# Patient Record
Sex: Female | Born: 1945 | Race: White | Hispanic: No | Marital: Married | Smoking: Former smoker
Health system: Southern US, Community
[De-identification: ages and names within clinical notes are randomized; demographics above are authoritative.]

## PROBLEM LIST (undated history)

## (undated) DIAGNOSIS — E039 Hypothyroidism, unspecified: Secondary | ICD-10-CM

## (undated) DIAGNOSIS — Z923 Personal history of irradiation: Secondary | ICD-10-CM

## (undated) DIAGNOSIS — K635 Polyp of colon: Secondary | ICD-10-CM

## (undated) DIAGNOSIS — M810 Age-related osteoporosis without current pathological fracture: Secondary | ICD-10-CM

## (undated) DIAGNOSIS — M1990 Unspecified osteoarthritis, unspecified site: Secondary | ICD-10-CM

## (undated) DIAGNOSIS — Z9889 Other specified postprocedural states: Secondary | ICD-10-CM

## (undated) HISTORY — DX: Hypothyroidism, unspecified: E03.9

## (undated) HISTORY — DX: Polyp of colon: K63.5

## (undated) HISTORY — DX: Age-related osteoporosis without current pathological fracture: M81.0

## (undated) HISTORY — PX: CERVICAL LAMINECTOMY: SHX94

## (undated) HISTORY — PX: BREAST LUMPECTOMY: SHX2

## (undated) HISTORY — PX: TUBAL LIGATION: SHX77

---

## 2010-11-20 DIAGNOSIS — E559 Vitamin D deficiency, unspecified: Secondary | ICD-10-CM | POA: Insufficient documentation

## 2010-12-12 DIAGNOSIS — E119 Type 2 diabetes mellitus without complications: Secondary | ICD-10-CM | POA: Insufficient documentation

## 2012-08-17 DIAGNOSIS — M4802 Spinal stenosis, cervical region: Secondary | ICD-10-CM | POA: Insufficient documentation

## 2018-07-28 DIAGNOSIS — E039 Hypothyroidism, unspecified: Secondary | ICD-10-CM | POA: Insufficient documentation

## 2018-07-28 DIAGNOSIS — M810 Age-related osteoporosis without current pathological fracture: Secondary | ICD-10-CM | POA: Insufficient documentation

## 2018-12-22 ENCOUNTER — Other Ambulatory Visit: Payer: Self-pay | Admitting: Physician Assistant

## 2018-12-22 DIAGNOSIS — Z1231 Encounter for screening mammogram for malignant neoplasm of breast: Secondary | ICD-10-CM

## 2019-01-14 ENCOUNTER — Other Ambulatory Visit: Payer: Self-pay | Admitting: Physician Assistant

## 2019-01-14 DIAGNOSIS — E2839 Other primary ovarian failure: Secondary | ICD-10-CM

## 2019-01-14 DIAGNOSIS — E782 Mixed hyperlipidemia: Secondary | ICD-10-CM | POA: Insufficient documentation

## 2019-01-26 ENCOUNTER — Ambulatory Visit
Admission: RE | Admit: 2019-01-26 | Discharge: 2019-01-26 | Disposition: A | Discharge status: Home / Self Care | Payer: Medicare Other | Source: Ambulatory Visit | Attending: Physician Assistant | Admitting: Physician Assistant

## 2019-01-26 DIAGNOSIS — Z1231 Encounter for screening mammogram for malignant neoplasm of breast: Secondary | ICD-10-CM

## 2019-03-22 ENCOUNTER — Other Ambulatory Visit: Payer: Self-pay

## 2019-05-05 ENCOUNTER — Ambulatory Visit
Admission: RE | Admit: 2019-05-05 | Discharge: 2019-05-05 | Disposition: A | Discharge status: Home / Self Care | Payer: Medicare Other | Source: Ambulatory Visit | Attending: Physician Assistant | Admitting: Physician Assistant

## 2019-05-05 DIAGNOSIS — E2839 Other primary ovarian failure: Secondary | ICD-10-CM

## 2019-12-21 ENCOUNTER — Other Ambulatory Visit: Payer: Self-pay | Admitting: Physician Assistant

## 2019-12-21 DIAGNOSIS — Z1231 Encounter for screening mammogram for malignant neoplasm of breast: Secondary | ICD-10-CM

## 2020-01-31 ENCOUNTER — Ambulatory Visit: Payer: Medicare Other

## 2020-03-26 ENCOUNTER — Other Ambulatory Visit: Payer: Self-pay

## 2020-03-26 ENCOUNTER — Ambulatory Visit
Admission: RE | Admit: 2020-03-26 | Discharge: 2020-03-26 | Disposition: A | Discharge status: Home / Self Care | Payer: Medicare Other | Source: Ambulatory Visit | Attending: Physician Assistant | Admitting: Physician Assistant

## 2020-03-26 DIAGNOSIS — Z1231 Encounter for screening mammogram for malignant neoplasm of breast: Secondary | ICD-10-CM

## 2020-07-06 ENCOUNTER — Telehealth: Payer: Self-pay | Admitting: Gastroenterology

## 2020-07-06 NOTE — Telephone Encounter (Signed)
Hi Dr. Ardis Hughs., we have received a referral from patient's PCP for a repeat colon. Patient had colon in 2016. Records have been received and they will be sent to you for review. Please advise on scheduling. Thank you.

## 2020-07-17 NOTE — Telephone Encounter (Signed)
   Colonoscopy April 2016 indication history of polyps.  Findings mild inflammation in the proximal ascending colon.  Biopsies were taken.  This showed patchy active colitis "main differential diagnosis includes NSAIDs, infection and Crohn's disease".    Colonoscopy March 2011, [place].  Indication history of colon polyps.  Findings for subcentimeter polyps.  All were removed.,  1 was a tubular adenoma, the other 3 were not precancerous or neoplastic even.  Colonoscopy October 2005, [place].  Indication "history of colon polyps, finding internal hemorrhoids.  The examination was normal otherwise.  Colonoscopy February 2002, indication FOB positive stool, single subcentimeter polyp was removed.  It was an adenoma.   Can you please contact her, let her know that I reviewed all of her records including her 4 colonoscopies from [place].  She has had 2 subcentimeter adenomas removed in about 15 years.  Current nationally recognized polyp surveillance guidelines would put her at at "routine risk" for colon polyps and colon cancer.  Letter know I think she needs colonoscopy April 2026.  If she has any questions or concerns am happy to discuss this with her in the office.  Thank you

## 2020-07-18 NOTE — Telephone Encounter (Signed)
Left message to call back  

## 2020-07-20 ENCOUNTER — Encounter: Payer: Self-pay | Admitting: Gastroenterology

## 2020-09-19 ENCOUNTER — Encounter: Payer: Self-pay | Admitting: Gastroenterology

## 2020-09-19 ENCOUNTER — Ambulatory Visit: Payer: Medicare Other | Admitting: Gastroenterology

## 2020-09-19 VITALS — BP 148/74 | HR 68 | Ht 62.0 in | Wt 133.6 lb

## 2020-09-19 DIAGNOSIS — Z86010 Personal history of colonic polyps: Secondary | ICD-10-CM

## 2020-09-19 NOTE — Patient Instructions (Addendum)
If you are age 73 or older, your body mass index should be between 23-30. Your Body mass index is 24.44 kg/m. If this is out of the aforementioned range listed, please consider follow up with your Primary Care Provider.  If you are age 29 or younger, your body mass index should be between 19-25. Your Body mass index is 24.44 kg/m. If this is out of the aformentioned range listed, please consider follow up with your Primary Care Provider.   You are due for colonoscopy in April 2026.  We will contact you when it is time to schedule.  You will follow up with our office on an as needed basis.  Thank you for entrusting me with your care and choosing [facility].  Dr Ardis Hughs

## 2020-09-19 NOTE — Progress Notes (Signed)
HPI: This is a very pleasant 74 year old woman who was referred to me by Nicholes Rough, PA-C  to evaluate history of colon polyps.    She is here with her husband today.  They moved from [place] about 2 years ago.  I reviewed extensive records from her previous gastroenterology team in [place].  See those results all summarized below.  Colon cancer does not run in her family  Her weight is overall stable  She has no significant abdominal pains, no changes in her bowels, no constipation or diarrhea.  No overt GI bleeding  Old Data Reviewed: Colonoscopy April 2016 indication history of polyps.  Findings mild inflammation in the proximal ascending colon.  Biopsies were taken.  This showed patchy active colitis "main differential diagnosis includes NSAIDs, infection and Crohn's disease".    Colonoscopy March 2011, [place].  Indication history of colon polyps.  Findings for subcentimeter polyps.  All were removed.,  1 was a tubular adenoma, the other 3 were not precancerous or neoplastic even.  Colonoscopy October 2005, [place].  Indication "history of colon polyps, finding internal hemorrhoids.  The examination was normal otherwise.  Colonoscopy February 2002, indication FOB positive stool, single subcentimeter polyp was removed.  It was an adenoma.  Blood work April 2021; CBC was completely normal  Review of systems: Pertinent positive and negative review of systems were noted in the above HPI section. All other review negative.   Past Medical History:  Diagnosis Date  . Colon polyps   . Hypothyroidism   . Osteoporosis     Past Surgical History:  Procedure Laterality Date  . CERVICAL LAMINECTOMY    . TUBAL LIGATION      Current Outpatient Medications  Medication Sig Dispense Refill  . alendronate (FOSAMAX) 70 MG tablet Take 70 mg by mouth once a week.    Marland Kitchen aspirin EC 81 MG tablet Take 81 mg by mouth daily. Swallow whole.    Marland Kitchen CALCIUM PO Take 1,000 mg by mouth  daily.    . Cyanocobalamin ([employer] B12 QUICK DISSOLVE PO) Take 5,000 mcg by mouth daily.    Marland Kitchen levothyroxine (SYNTHROID) 88 MCG tablet Take 88 mcg by mouth daily.    Marland Kitchen lovastatin (MEVACOR) 40 MG tablet Take 40 mg by mouth at bedtime.    . Magnesium 500 MG CAPS Take by mouth.    . Multiple Vitamins-Minerals (MULTIVITAMIN ADULTS 50+ PO) Take 1 tablet by mouth daily.    . Omega-3 Fatty Acids (FISH OIL) 1000 MG CAPS Take 1 capsule by mouth daily.    . TURMERIC CURCUMIN PO Take 1 tablet by mouth daily.     No current facility-administered medications for this visit.    Allergies as of 09/19/2020 - Review Complete 09/19/2020  Allergen Reaction Noted  . Dust mite extract Cough 03/12/2020    Family History  Problem Relation Age of Onset  . Breast cancer Mother   . Diabetes Mother   . Colon cancer Neg Hx   . Esophageal cancer Neg Hx   . Pancreatic cancer Neg Hx   . Stomach cancer Neg Hx   . Liver disease Neg Hx     Social History   Socioeconomic History  . Marital status: Married    Spouse name: Not on file  . Number of children: Not on file  . Years of education: Not on file  . Highest education level: Not on file  Occupational History  . Not on file  Tobacco Use  . Smoking status: Former  Smoker  . Smokeless tobacco: Never Used  Substance and Sexual Activity  . Alcohol use: Yes    Comment: 1 drink daily  . Drug use: Never  . Sexual activity: Not on file  Other Topics Concern  . Not on file  Social History Narrative  . Not on file   Social Determinants of Health   Financial Resource Strain:   . Difficulty of Paying Living Expenses: Not on file  Food Insecurity:   . Worried About Charity fundraiser in the Last Year: Not on file  . Ran Out of Food in the Last Year: Not on file  Transportation Needs:   . Lack of Transportation (Medical): Not on file  . Lack of Transportation (Non-Medical): Not on file  Physical Activity:   . Days of Exercise per Week: Not on file  .  Minutes of Exercise per Session: Not on file  Stress:   . Feeling of Stress : Not on file  Social Connections:   . Frequency of Communication with Friends and Family: Not on file  . Frequency of Social Gatherings with Friends and Family: Not on file  . Attends Religious Services: Not on file  . Active Member of Clubs or Organizations: Not on file  . Attends Archivist Meetings: Not on file  . Marital Status: Not on file  Intimate Partner Violence:   . Fear of Current or Ex-Partner: Not on file  . Emotionally Abused: Not on file  . Physically Abused: Not on file  . Sexually Abused: Not on file     Physical Exam: BP (!) 148/74   Pulse 68   Ht 5\' 2"  (1.575 m)   Wt 133 lb 9.6 oz (60.6 kg)   BMI 24.44 kg/m  Constitutional: generally well-appearing Psychiatric: alert and oriented x3 Eyes: extraocular movements intact Mouth: oral pharynx moist, no lesions Neck: supple no lymphadenopathy Cardiovascular: heart regular rate and rhythm Lungs: clear to auscultation bilaterally Abdomen: soft, nontender, nondistended, no obvious ascites, no peritoneal signs, normal bowel sounds Extremities: no lower extremity edema bilaterally Skin: no lesions on visible extremities   Assessment and plan: 74 y.o. female with history of adenomatous colon polyps but considered to be at routine risk for colon cancer according to current national colon cancer screening, surveillance guidelines  We had a very nice discussion about colon polyps.  Very nice discussion about the evolution of colon cancer screening, polyp surveillance guidelines.  I explained to her that she is considered to be at routine risk for colon cancer at this point after golf her 4 colonoscopies.  She understands that her next routine colon cancer screening test would be in 2026.  She knows to call here anytime sooner than that if she has any questions or concerns and certainly if she develops any symptoms such as bleeding,  significant abdominal pains, changes in her bowels.    Please see the "Patient Instructions" section for addition details about the plan.   Owens Loffler, MD [facility] 09/19/2020, 3:02 PM  Cc: Nicholes Rough, PA-C  Total time on date of encounter was 35  minutes (this included time spent preparing to see the patient reviewing records; obtaining and/or reviewing separately obtained history; performing a medically appropriate exam and/or evaluation; counseling and educating the patient and family if present; ordering medications, tests or procedures if applicable; and documenting clinical information in the health record).

## 2021-02-18 ENCOUNTER — Other Ambulatory Visit: Payer: Self-pay | Admitting: Physician Assistant

## 2021-02-18 DIAGNOSIS — Z1231 Encounter for screening mammogram for malignant neoplasm of breast: Secondary | ICD-10-CM

## 2021-04-11 ENCOUNTER — Ambulatory Visit
Admission: RE | Admit: 2021-04-11 | Discharge: 2021-04-11 | Disposition: A | Discharge status: Home / Self Care | Payer: Medicare Other | Source: Ambulatory Visit | Attending: Physician Assistant | Admitting: Physician Assistant

## 2021-04-11 ENCOUNTER — Other Ambulatory Visit: Payer: Self-pay

## 2021-04-11 DIAGNOSIS — Z1231 Encounter for screening mammogram for malignant neoplasm of breast: Secondary | ICD-10-CM

## 2022-03-03 ENCOUNTER — Other Ambulatory Visit: Payer: Self-pay | Admitting: Physician Assistant

## 2022-03-03 DIAGNOSIS — Z1231 Encounter for screening mammogram for malignant neoplasm of breast: Secondary | ICD-10-CM

## 2022-04-14 ENCOUNTER — Ambulatory Visit
Admission: RE | Admit: 2022-04-14 | Discharge: 2022-04-14 | Disposition: A | Discharge status: Home / Self Care | Payer: Medicare Other | Source: Ambulatory Visit | Attending: Physician Assistant | Admitting: Physician Assistant

## 2022-04-14 DIAGNOSIS — Z1231 Encounter for screening mammogram for malignant neoplasm of breast: Secondary | ICD-10-CM

## 2022-04-15 ENCOUNTER — Other Ambulatory Visit: Payer: Self-pay | Admitting: Physician Assistant

## 2022-04-15 DIAGNOSIS — R928 Other abnormal and inconclusive findings on diagnostic imaging of breast: Secondary | ICD-10-CM

## 2022-04-23 ENCOUNTER — Ambulatory Visit
Admission: RE | Admit: 2022-04-23 | Discharge: 2022-04-23 | Disposition: A | Discharge status: Home / Self Care | Payer: Medicare Other | Source: Ambulatory Visit | Attending: Physician Assistant | Admitting: Physician Assistant

## 2022-04-23 ENCOUNTER — Other Ambulatory Visit: Payer: Self-pay | Admitting: Physician Assistant

## 2022-04-23 DIAGNOSIS — R928 Other abnormal and inconclusive findings on diagnostic imaging of breast: Secondary | ICD-10-CM

## 2022-04-23 DIAGNOSIS — N6320 Unspecified lump in the left breast, unspecified quadrant: Secondary | ICD-10-CM

## 2022-05-02 ENCOUNTER — Ambulatory Visit
Admission: RE | Admit: 2022-05-02 | Discharge: 2022-05-02 | Disposition: A | Discharge status: Home / Self Care | Payer: Medicare Other | Source: Ambulatory Visit | Attending: Physician Assistant | Admitting: Physician Assistant

## 2022-05-02 DIAGNOSIS — N6320 Unspecified lump in the left breast, unspecified quadrant: Secondary | ICD-10-CM

## 2022-05-15 ENCOUNTER — Other Ambulatory Visit: Payer: Self-pay | Admitting: Physician Assistant

## 2022-05-15 DIAGNOSIS — R921 Mammographic calcification found on diagnostic imaging of breast: Secondary | ICD-10-CM

## 2022-05-20 ENCOUNTER — Ambulatory Visit
Admission: RE | Admit: 2022-05-20 | Discharge: 2022-05-20 | Disposition: A | Discharge status: Home / Self Care | Payer: Medicare Other | Source: Ambulatory Visit | Attending: Physician Assistant | Admitting: Physician Assistant

## 2022-05-20 ENCOUNTER — Other Ambulatory Visit: Payer: Self-pay | Admitting: Physician Assistant

## 2022-05-20 ENCOUNTER — Ambulatory Visit: Payer: Self-pay | Admitting: Surgery

## 2022-05-20 DIAGNOSIS — R921 Mammographic calcification found on diagnostic imaging of breast: Secondary | ICD-10-CM

## 2022-05-20 DIAGNOSIS — C50912 Malignant neoplasm of unspecified site of left female breast: Secondary | ICD-10-CM

## 2022-05-23 ENCOUNTER — Other Ambulatory Visit: Payer: Self-pay | Admitting: Surgery

## 2022-05-23 ENCOUNTER — Telehealth: Payer: Self-pay | Admitting: Hematology and Oncology

## 2022-05-23 DIAGNOSIS — C50912 Malignant neoplasm of unspecified site of left female breast: Secondary | ICD-10-CM

## 2022-05-26 NOTE — Progress Notes (Signed)
Radiation Oncology         ([phone]) [phone] ________________________________  Initial Outpatient Consultation  Name: JEANNET MACFARLANE MRN: [id number]  Date: 05/27/2022  DOB: 1946/03/12  WU:JWJX, Sheri, PA-C  Harriette Bouillon, MD   REFERRING PHYSICIAN: Harriette Bouillon, MD  DIAGNOSIS:    ICD-10-CM   1. Malignant neoplasm of upper-outer quadrant of left breast in female, estrogen receptor positive (HCC)  C50.412    Z17.0      STAGE I LEFT BREAST CANCER  Left Breast UOQ Invasive Ductal Carcinoma, ER+ / PR+ / Her2-, Grade 1    CHIEF COMPLAINT: Here to discuss management of left breast cancer  HISTORY OF PRESENT ILLNESS::Ranell E Helminiak is a 76 y.o. female who presented with breast abnormality on the following imaging: bilateral screening mammogram on the date of 04/14/22.  No symptoms, if any, were reported at that time.  Diagnostic left breast mammogram and left breast ultrasound on 04/23/22 further revealed an indeterminate tiny suspicious mass without sonographic correlate in the central left breast. No left axillary adenopathy was appreciated.    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  ER status: 95% positive; PR status 90% positive (both with strong staining intensity); Proliferation marker Ki67 at 1%; Her2 status negative; Grade 1.  Left breast diagnostic mammogram on 05/20/22 revealed indeterminate calcification within the superior central left breast measuring 5 cm.   Biopsy of the upper outer left breast on 05/20/22 showed benign breast tissue with findings suggestive of a small radial scar, and usual ductal hyperplasia with associated calcifications in the background of fibrocystic changes.   The patient also met with Dr. Luisa Hart on 05/20/22 to discuss surgical options. Following discussion of the risks and benefits, the patient agreed to proceed with breast conserving surgery  without SLN biopsies. The patient is scheduled for surgery on 06/26/22 with Dr. Luisa Hart.   She and her husband used to live in [place].  They are great-grandparents.  She likes to mow her lawn.  She is very active.  PREVIOUS RADIATION THERAPY: No  PAST MEDICAL HISTORY:  has a past medical history of Colon polyps, Hypothyroidism, and Osteoporosis.    PAST SURGICAL HISTORY: Past Surgical History:  Procedure Laterality Date   CERVICAL LAMINECTOMY     TUBAL LIGATION      FAMILY HISTORY: family history includes Breast cancer in her mother; Diabetes in her mother.  SOCIAL HISTORY:  reports that she quit smoking about 43 years ago. Her smoking use included cigarettes. She has never used smokeless tobacco. She reports current alcohol use. She reports that she does not use drugs.  ALLERGIES: Dust mite extract  MEDICATIONS:  Current Outpatient Medications  Medication Sig Dispense Refill   Cholecalciferol (D3-1000) 25 MCG (1000 UT) capsule Take 1,000 Units by mouth at bedtime.     alendronate (FOSAMAX) 70 MG tablet Take 70 mg by mouth once a week.     aspirin EC 81 MG tablet Take 81 mg by mouth daily. Swallow whole.     Cyanocobalamin ([employer] B12 QUICK DISSOLVE PO) Take 5,000 mcg by mouth daily.     levothyroxine (SYNTHROID) 75 MCG tablet Take 75 mcg by mouth daily.     lovastatin (MEVACOR) 40 MG tablet Take 40 mg by mouth at bedtime.     Magnesium 500 MG CAPS Take by mouth.     Multiple Vitamins-Minerals (MULTIVITAMIN ADULTS 50+ PO) Take 1 tablet by mouth daily.  Omega-3 Fatty Acids (FISH OIL) 1000 MG CAPS Take 1 capsule by mouth daily.     TURMERIC CURCUMIN PO Take 1 tablet by mouth daily.     No current facility-administered medications for this encounter.    REVIEW OF SYSTEMS: As above in HPI.   PHYSICAL EXAM:  weight is 129 lb (58.5 kg). Her oral temperature is 98 F (36.7 C). Her blood pressure is 133/59 (abnormal) and her pulse is 71. Her respiration is 18 and oxygen saturation  is 96%.   General: Alert and oriented, in no acute distress HEENT: Head is normocephalic. Extraocular movements are intact. Oropharynx is clear. Heart: Regular in rate and rhythm with no murmurs, rubs, or gallops. Chest: Clear to auscultation bilaterally, with no rhonchi, wheezes, or rales. Extremities: No cyanosis or edema. Lymphatics: see Neck Exam Skin: No concerning lesions. Musculoskeletal: symmetric strength and muscle tone throughout. Neurologic: Cranial nerves II through XII are grossly intact. No obvious focalities. Speech is fluent. Coordination is intact. Psychiatric: Judgment and insight are intact. Affect is appropriate. Breasts: Left nipple inversion.  Tenderness and mild swelling and bruising in lateral left breast at site of biopsy. No other palpable masses appreciated in the breasts or axillae bilaterally.    ECOG = 0  0 - Asymptomatic (Fully active, able to carry on all predisease activities without restriction)  1 - Symptomatic but completely ambulatory (Restricted in physically strenuous activity but ambulatory and able to carry out work of a light or sedentary nature. For example, light housework, office work)  2 - Symptomatic, <50% in bed during the day (Ambulatory and capable of all self care but unable to carry out any work activities. Up and about more than 50% of waking hours)  3 - Symptomatic, >50% in bed, but not bedbound (Capable of only limited self-care, confined to bed or chair 50% or more of waking hours)  4 - Bedbound (Completely disabled. Cannot carry on any self-care. Totally confined to bed or chair)  5 - Death   Santiago Glad MM, Creech RH, Tormey DC, et al. [phone]). "Toxicity and response criteria of the [facility]". Am. Evlyn Clines. Oncol. 5 (6): 649-55   LABORATORY DATA:  No results found for: "WBC", "HGB", "HCT", "MCV", "PLT" CMP  No results found for: "NA", "K", "CL", "CO2", "GLUCOSE", "BUN", "CREATININE", "CALCIUM", "PROT",  "ALBUMIN", "AST", "ALT", "ALKPHOS", "BILITOT", "GFRNONAA", "GFRAA"       RADIOGRAPHY: MM LT BREAST BX W LOC DEV 1ST LESION IMAGE BX SPEC STEREO GUIDE  Addendum Date: 05/23/2022   ADDENDUM REPORT: 05/22/2022 22:36 ADDENDUM: Pathology revealed BENIGN BREAST TISSUE WITH FINDINGS SUGGESTIVE OF A SMALL RADIAL SCAR USUAL DUCTAL HYPERPLASIA WITH ASSOCIATED CALCIFICATIONS IN THE BACKGROUND OF FIBROCYSTIC CHANGE, MILD ADENOSIS AND FOCAL FIBROADENOMATOID CHANGE of the LEFT breast, superior central, (coil clip). This was found to be concordant by Dr. Annia Belt, with excision recommended. Pathology results were discussed with the patient by telephone. The patient reported doing well after the biopsy with tenderness at the site. Post biopsy instructions and care were reviewed and questions were answered. The patient was encouraged to call [facility] for any additional concerns. My direct phone number was provided. The patient has a recent diagnosis of LEFT breast cancer and should follow her outlined treatment plan. Dr. Harriette Bouillon was notified of biopsy results via EPIC message on May 22, 2022. Pathology results reported by Rene Kocher, RN on 05/22/2022. Electronically Signed   By: Annia Belt M.D.   On:  05/22/2022 22:36   Result Date: 05/22/2022 CLINICAL DATA:  Patient with indeterminate calcifications left breast. EXAM: LEFT BREAST STEREOTACTIC CORE NEEDLE BIOPSY COMPARISON:  [facility] FINDINGS: The patient and I discussed the procedure of stereotactic-guided biopsy including benefits and alternatives. We discussed the high likelihood of a successful procedure. We discussed the risks of the procedure including infection, bleeding, tissue injury, clip migration, and inadequate sampling. Informed written consent was given. The usual time out protocol was performed immediately prior to the procedure. Using sterile technique and 1% Lidocaine as local anesthetic, under stereotactic guidance,  a 9 gauge vacuum assisted device was used to perform core needle biopsy of calcifications within the superior central left breast using a cranial approach. Specimen radiograph was performed showing calcifications. Specimens with calcifications are identified for pathology. Lesion quadrant: Upper outer quadrant At the conclusion of the procedure, coil shaped tissue marker clip was deployed into the biopsy cavity. Follow-up 2-view mammogram was performed and dictated separately. IMPRESSION: Stereotactic-guided biopsy of left breast calcifications. No apparent complications. Electronically Signed: By: Annia Belt M.D. On: 05/20/2022 08:59  MM CLIP PLACEMENT LEFT  Result Date: 05/20/2022 CLINICAL DATA:  Status post stereo biopsy left breast calcifications. EXAM: 3D DIAGNOSTIC LEFT MAMMOGRAM POST STEREOTACTIC BIOPSY COMPARISON:  Previous exam(s). FINDINGS: 3D Mammographic images were obtained following stereotactic guided biopsy of left breast calcifications. The biopsy marking clip is in expected position at the site of biopsy. IMPRESSION: Appropriate positioning of the coil shaped biopsy marking clip at the site of biopsy in the upper-outer left breast. Final Assessment: Post Procedure Mammograms for Marker Placement Electronically Signed   By: Annia Belt M.D.   On: 05/20/2022 09:02  MM Digital Diagnostic Unilat L  Result Date: 05/20/2022 CLINICAL DATA:  Patient with recent diagnosis of left breast cancer for evaluation of left breast calcifications. EXAM: DIGITAL DIAGNOSTIC UNILATERAL LEFT MAMMOGRAM WITH CAD TECHNIQUE: Left digital diagnostic mammography was performed. Mammographic images were processed with CAD. COMPARISON:  Previous exam(s). ACR Breast Density Category c: The breast tissue is heterogeneously dense, which may obscure small masses. FINDINGS: X shaped marking clip is demonstrated within the central left breast. Extending superiorly from the X shaped marking clip there is approximately 5 cm of  round and punctate calcifications, indeterminate in etiology. IMPRESSION: Indeterminate calcifications within the superior central left breast RECOMMENDATION: Stereotactic biopsy left breast calcifications. I have discussed the findings and recommendations with the patient. If applicable, a reminder letter will be sent to the patient regarding the next appointment. BI-RADS CATEGORY  4: Suspicious. Electronically Signed   By: Annia Belt M.D.   On: 05/20/2022 08:43  MM LT BREAST BX W LOC DEV 1ST LESION IMAGE BX SPEC STEREO GUIDE  Addendum Date: 05/12/2022   ADDENDUM REPORT: 05/12/2022 10:54 ADDENDUM: Pathology revealed Breast, LEFT, needle core biopsy, lateral/posterior, (X shaped clip)- INVASIVE DUCTAL CARCINOMA, GRADE 1.- DUCTAL CARCINOMA IN SITU, LOW TO INTERMEDIATE GRADE. This was found to be concordant by Dr. Baird Lyons. Pathology results were discussed with the patient by telephone. The patient reported doing well after the biopsy with tenderness at the site. Post biopsy instructions and care were reviewed and questions were answered. The patient was encouraged to call [facility] for any additional concerns. Per patient request, after discussing results with Ladora Daniel PA of [facility], surgical consultation has been arranged with Dr. Harriette Bouillon at [facility] on May 20, 2022. Given the patient has invasive ductal carcinoma and ductal carcinoma in situ, magnification views  and possible biopsy of calcifications in the upper outer quadrant of left breast is recommended. Recommendations called to [facility] on 05/12/2022. Pathology results reported by Collene Mares RN on 05/09/2022. Electronically Signed   By: Baird Lyons M.D.   On: 05/12/2022 10:54   Result Date: 05/12/2022 CLINICAL DATA:  Left breast mass. EXAM: LEFT BREAST STEREOTACTIC CORE NEEDLE BIOPSY COMPARISON:  With priors FINDINGS: The patient and I discussed the procedure of  stereotactic-guided biopsy including benefits and alternatives. We discussed the high likelihood of a successful procedure. We discussed the risks of the procedure including infection, bleeding, tissue injury, clip migration, and inadequate sampling. Informed written consent was given. The usual time out protocol was performed immediately prior to the procedure. Using sterile technique and 1% lidocaine and 1% lidocaine with epinephrine as local anesthetic, under stereotactic guidance, a 9 gauge vacuum assisted device was used to perform core needle biopsy of a possible mass in the lateral/posterior aspect of the left breast using a superior to inferior approach. Lesion quadrant: Lateral/posterior At the conclusion of the procedure, X shaped tissue marker clip was deployed into the biopsy cavity. Follow-up 2-view mammogram was performed and dictated separately. IMPRESSION: Stereotactic-guided biopsy of the left breast. No apparent complications. Electronically Signed: By: Baird Lyons M.D. On: 05/02/2022 11:57  MM CLIP PLACEMENT LEFT  Result Date: 05/02/2022 CLINICAL DATA:  Status post stereotactic biopsy of the left breast. EXAM: 3D DIAGNOSTIC LEFT MAMMOGRAM POST ULTRASOUND BIOPSY COMPARISON:  Previous exam(s). FINDINGS: 3D Mammographic images were obtained following stereotactic guided biopsy of the left breast. The biopsy marking clip is in the lateral aspect of the left breast. IMPRESSION: Appropriate positioning of the X shaped biopsy marking clip at the site of biopsy in the lateral/posterior aspect of the left breast. Final Assessment: Post Procedure Mammograms for Marker Placement Electronically Signed   By: Baird Lyons M.D.   On: 05/02/2022 11:59     IMPRESSION/PLAN: Left breast cancer, ER positive, stage I  She is a good candidate for breast conserving surgery and she anticipates lumpectomy in July  For the patient's early stage favorable risk breast cancer, we had a thorough discussion about her  options for adjuvant therapy. One option would be antiestrogen therapy as discussed with medical oncology. She would take a pill for approximately 5 years. The more aggressive option would be to pursue both modalities.   Of note, I discussed the data from the [employer] al trial in the [place] Journal of Medicine. She understands that tamoxifen compared to radiation plus tamoxifen demonstrated no survival benefit among the women in this study. The women were 70 years or older with stage I estrogen receptor positive breast cancer. Based on this study, I told the patient that her overall life expectancy should not be affected by adding radiotherapy to antiestrogen medication. She understands that the main benefit of  adding radiotherapy to anti estrogen therapy would be a very small but measurable local control benefit (risk of local recurrence to be lowered from ~9% --> ~2% over a decade).  We discussed the fact that radiotherapy only provides a local control benefit while anti-estrogen pills provide systemic coverage. That being said, the risk of systemic failure is relatively low with her type of breast cancer.   We discussed the risks benefits and side effects of radiotherapy. She understands that the side effects would likely include some skin irritation and fatigue during the weeks of radiation. There is a risk of late effects which include but  are not necessarily limited to cosmetic changes and rare lung toxicity. I would anticipate delivering approximately 1-4 weeks of radiotherapy    After a thorough discussion of standard hypofractionation over 3-4 weeks, we discussed 1 week of ultra hypofractionated radiation therapy. I discussed that this approach is a bit less standard than longer regimens.  The Fast Forward trial (1 week) method has shown a slight increase in normal tissue side effects over the 3 week hypofractionation standard at 5 years of follow-up. This includes effects like induration, and  edema.  However, for elderly patients that are keen on the convenience of minimizing the number of procedures/visits to the cancer center, this is a nevertheless a reasonable approach to consider and the vast majority of patients do very well. There is also an option of RT once weekly for 5 weeks that we discussed.   She is enthusiastic about strongly considering radiation therapy and would like to regroup with me to iron  out the details postoperatively.   We spoke about other general acute effects of breast radiation including skin irritation and fatigue as well as breast fibrosis, induration, and /or swelling long term, and much less common late effects including internal organ injury or irritation. We spoke about the latest technology that is used to minimize the risk of late effects for patients undergoing radiotherapy to the breast or chest wall. No guarantees of treatment were given. The patient is enthusiastic about proceeding with treatment.  I look forward to participating in the patient's care.  I will await her referral back to me for postoperative follow-up and eventual CT simulation/treatment planning.       On date of service, in total, I spent 50 minutes on this encounter. Patient was seen in person.   __________________________________________   Lonie Peak, MD  This document serves as a record of services personally performed by Lonie Peak, MD. It was created on her behalf by Neena Rhymes, a trained medical scribe. The creation of this record is based on the scribe's personal observations and the provider's statements to them. This document has been checked and approved by the attending provider.

## 2022-05-27 ENCOUNTER — Encounter: Payer: Self-pay | Admitting: Radiation Oncology

## 2022-05-27 ENCOUNTER — Ambulatory Visit
Admission: RE | Admit: 2022-05-27 | Discharge: 2022-05-27 | Disposition: A | Discharge status: Home / Self Care | Payer: Medicare Other | Source: Ambulatory Visit | Attending: Radiation Oncology | Admitting: Radiation Oncology

## 2022-05-27 ENCOUNTER — Other Ambulatory Visit: Payer: Self-pay

## 2022-05-27 VITALS — BP 133/59 | HR 71 | Temp 98.0°F | Resp 18 | Wt 129.0 lb

## 2022-05-27 DIAGNOSIS — C50412 Malignant neoplasm of upper-outer quadrant of left female breast: Secondary | ICD-10-CM | POA: Insufficient documentation

## 2022-05-27 DIAGNOSIS — Z170 Estrogen receptor positive status [ER+]: Secondary | ICD-10-CM | POA: Diagnosis not present

## 2022-05-31 DIAGNOSIS — C801 Malignant (primary) neoplasm, unspecified: Secondary | ICD-10-CM

## 2022-05-31 HISTORY — DX: Malignant (primary) neoplasm, unspecified: C80.1

## 2022-06-06 ENCOUNTER — Inpatient Hospital Stay: Payer: Medicare Other | Attending: Hematology and Oncology | Admitting: Hematology and Oncology

## 2022-06-06 ENCOUNTER — Other Ambulatory Visit: Payer: Self-pay

## 2022-06-06 ENCOUNTER — Inpatient Hospital Stay: Payer: Medicare Other

## 2022-06-06 ENCOUNTER — Encounter: Payer: Self-pay | Admitting: Hematology and Oncology

## 2022-06-06 DIAGNOSIS — E039 Hypothyroidism, unspecified: Secondary | ICD-10-CM | POA: Diagnosis not present

## 2022-06-06 DIAGNOSIS — Z79899 Other long term (current) drug therapy: Secondary | ICD-10-CM | POA: Insufficient documentation

## 2022-06-06 DIAGNOSIS — Z803 Family history of malignant neoplasm of breast: Secondary | ICD-10-CM | POA: Insufficient documentation

## 2022-06-06 DIAGNOSIS — Z79890 Hormone replacement therapy (postmenopausal): Secondary | ICD-10-CM | POA: Insufficient documentation

## 2022-06-06 DIAGNOSIS — Z7982 Long term (current) use of aspirin: Secondary | ICD-10-CM | POA: Diagnosis not present

## 2022-06-06 DIAGNOSIS — Z87891 Personal history of nicotine dependence: Secondary | ICD-10-CM | POA: Diagnosis not present

## 2022-06-06 DIAGNOSIS — Z170 Estrogen receptor positive status [ER+]: Secondary | ICD-10-CM | POA: Insufficient documentation

## 2022-06-06 DIAGNOSIS — C50512 Malignant neoplasm of lower-outer quadrant of left female breast: Secondary | ICD-10-CM | POA: Diagnosis not present

## 2022-06-06 NOTE — Assessment & Plan Note (Signed)
This is a very pleasant 76 year old postmenopausal female patient with left breast lower outer invasive ductal carcinoma, grade 1, ER/PR strongly positive, Ki-67 of 1%, HER2 negative along with intermediate grade DCIS referred to medical oncology for additional recommendations. Patient arrived to the appointment today with her husband.  She is currently scheduled for left breast partial mastectomy on June 26, 2022.  She has already met with radiation oncology team and is not quite sure about considering radiation.  She is here to discuss about adjuvant antiestrogen therapy.  Today we have discussed about the pathology in detail and the role of Oncotype testing as well as role of antiestrogen therapy.  We have discussed about Oncotype Dx score which is a well validated prognostic scoring system which can predict outcome with endocrine therapy alone and whether chemotherapy reduces recurrence.  Typically in patients with ER positive cancers that are node negative if the RS score is high typically greater than or equal to 26, chemotherapy is recommended.  In women with intermediate recurrence score younger than 45, there can still be some role for chemotherapy in addition to endocrine therapy especially if the recurrence score is between 21-25. If chemotherapy is needed, this will precede radiation and then after radiation she will continue on antiestrogen therapy.  We have discussed options for antiestrogen therapy today but focused our discussion mostly on aromatase inhibitors.  We have discussed mechanism of action of aromatase inhibitors, adverse effects from aromatase inhibitors including but not limited to postmenopausal symptoms, arthralgias, vaginal dryness, bone loss etc.  She is already on alendronate for osteoporosis management.  We will continue to monitor bone density while on aromatase inhibitors.  She understands the benefits and adverse effects of antiestrogen therapy.  She is willing to consider  Oncotype testing, she understands that she most likely will not need any adjuvant chemotherapy but Oncotype will also shared some information on prognosis.  She will return to clinic after surgery to review final pathology.  If she is not willing to proceed with radiation, she will start antiestrogen therapy right away.  If she is willing to proceed with radiation, she will start antiestrogen therapy after completion of radiation.

## 2022-06-06 NOTE — Progress Notes (Signed)
[facility] CONSULT NOTE  Patient Care Team: Nicholes Rough, PA-C as PCP - General (Physician Assistant)  CHIEF COMPLAINTS/PURPOSE OF CONSULTATION:  Newly diagnosed breast cancer  HISTORY OF PRESENTING ILLNESS:  Kristin Molina 76 y.o. female is here because of recent diagnosis of left breast cancer.  I reviewed her records extensively and collaborated the history with the patient.  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  Diagnostic mammogram showed indeterminate findings suspicious mass without sonographic correlate in the central left breast.   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  She has seen surgical oncology and is scheduled for surgery June 26, 2022.  She arrived today with her husband.  She is healthy at baseline except for osteoporosis and takes Fosamax.  She denies any other prior abnormal mammograms.  Family history of breast cancer in mom in her 67s.  Rest of the pertinent 10 point ROS reviewed and negative  MEDICAL HISTORY:  Past Medical History:  Diagnosis Date   Colon polyps    Hypothyroidism    Osteoporosis     SURGICAL HISTORY: Past Surgical History:  Procedure Laterality Date   CERVICAL LAMINECTOMY     TUBAL LIGATION      SOCIAL HISTORY: Social History   Socioeconomic History   Marital status: Married    Spouse name: Not on file    Number of children: Not on file   Years of education: Not on file   Highest education level: Not on file  Occupational History   Not on file  Tobacco Use   Smoking status: Former    Types: Cigarettes    Quit date: 1980    Years since quitting: 43.5   Smokeless tobacco: Never  Vaping Use   Vaping Use: Never used  Substance and Sexual Activity   Alcohol use: Yes    Comment: 1 drink daily   Drug use: Never   Sexual activity: Yes    Birth control/protection: Post-menopausal  Other Topics Concern   Not on file  Social History Narrative   Not on file   Social Determinants of Health   Financial Resource Strain: Not on file  Food Insecurity: Not on file  Transportation Needs: Not on file  Physical Activity: Not on file  Stress: Not on file  Social Connections: Not on file  Intimate Partner Violence: Not on file    FAMILY HISTORY: Family History  Problem Relation Age of Onset   Breast cancer Mother    Diabetes Mother    Colon cancer Neg Hx    Esophageal cancer Neg Hx    Pancreatic cancer Neg Hx    Stomach cancer Neg Hx    Liver disease Neg Hx     ALLERGIES:  is allergic to dust mite extract.  MEDICATIONS:  Current Outpatient Medications  Medication Sig Dispense Refill   alendronate (FOSAMAX) 70 MG tablet Take 70 mg by mouth once a week.     aspirin EC 81  MG tablet Take 81 mg by mouth daily. Swallow whole.     Cholecalciferol (D3-1000) 25 MCG (1000 UT) capsule Take 1,000 Units by mouth at bedtime.     Cyanocobalamin ([employer] B12 QUICK DISSOLVE PO) Take 5,000 mcg by mouth daily.     levothyroxine (SYNTHROID) 75 MCG tablet Take 75 mcg by mouth daily.     lovastatin (MEVACOR) 40 MG tablet Take 40 mg by mouth at bedtime.     Magnesium 500 MG CAPS Take by mouth.     Multiple Vitamins-Minerals (MULTIVITAMIN ADULTS 50+ PO) Take 1 tablet by mouth daily.     Omega-3 Fatty Acids (FISH OIL) 1000 MG CAPS Take 1 capsule by mouth daily.     TURMERIC CURCUMIN PO Take 1 tablet by mouth  daily.     No current facility-administered medications for this visit.    REVIEW OF SYSTEMS:   Constitutional: Denies fevers, chills or abnormal night sweats Eyes: Denies blurriness of vision, double vision or watery eyes Ears, nose, mouth, throat, and face: Denies mucositis or sore throat Respiratory: Denies cough, dyspnea or wheezes Cardiovascular: Denies palpitation, chest discomfort or lower extremity swelling Gastrointestinal:  Denies nausea, heartburn or change in bowel habits Skin: Denies abnormal skin rashes Lymphatics: Denies new lymphadenopathy or easy bruising Neurological:Denies numbness, tingling or new weaknesses Behavioral/Psych: Mood is stable, no new changes  Breast: Denies any palpable lumps or discharge All other systems were reviewed with the patient and are negative.  PHYSICAL EXAMINATION: ECOG PERFORMANCE STATUS: 0 - Asymptomatic  Vitals:   06/06/22 1052  BP: 139/67  Pulse: 88  Temp: 98.1 F (36.7 C)  SpO2: 100%   Filed Weights   06/06/22 1052  Weight: 129 lb 9.6 oz (58.8 kg)    GENERAL:alert, no distress and comfortable SKIN: skin color, texture, turgor are normal, no rashes or significant lesions EYES: normal, conjunctiva are pink and non-injected, sclera clear OROPHARYNX:no exudate, no erythema and lips, buccal mucosa, and tongue normal  NECK: supple, thyroid normal size, non-tender, without nodularity LYMPH:  no palpable lymphadenopathy in the cervical, axillary or inguinal LUNGS: clear to auscultation and percussion with normal breathing effort HEART: regular rate & rhythm and no murmurs and no lower extremity edema ABDOMEN:abdomen soft, non-tender and normal bowel sounds Musculoskeletal:no cyanosis of digits and no clubbing  PSYCH: alert & oriented x 3 with fluent speech NEURO: no focal motor/sensory deficits BREAST:No palpable nodules in breast. No palpable axillary or supraclavicular lymphadenopathy  LABORATORY DATA:  I have reviewed  the data as listed No results found for: "WBC", "HGB", "HCT", "MCV", "PLT" No results found for: "NA", "K", "CL", "CO2"  RADIOGRAPHIC STUDIES: I have personally reviewed the radiological reports and agreed with the findings in the report.  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 Patient arrived to the appointment today with her husband.  She is currently scheduled for left breast partial mastectomy on June 26, 2022.  She has already met with radiation oncology team and is not quite sure about considering radiation.  She is here to discuss about adjuvant antiestrogen therapy.  Today we have discussed about the pathology in detail and the role of Oncotype testing as well as role of antiestrogen therapy.  We have discussed  about Oncotype Dx score which is a well validated prognostic scoring system which can predict outcome with endocrine therapy alone and whether chemotherapy reduces recurrence.  Typically in patients with ER positive cancers that are node negative if the RS score is high typically greater than or equal to 26, chemotherapy is recommended.  In women with intermediate recurrence score younger than 77, there can still be some role for chemotherapy in addition to endocrine therapy especially if the recurrence score is between 21-25. If chemotherapy is needed, this will precede radiation and then after radiation she will continue on antiestrogen therapy.  We have discussed options for antiestrogen therapy today but focused our discussion mostly on aromatase inhibitors.  We have discussed mechanism of action of aromatase inhibitors, adverse  effects from aromatase inhibitors including but not limited to postmenopausal symptoms, arthralgias, vaginal dryness, bone loss etc.  She is already on alendronate for osteoporosis management.  We will continue to monitor bone density while on aromatase inhibitors.  She understands the benefits and adverse effects of antiestrogen therapy.  She is willing to consider Oncotype testing, she understands that she most likely will not need any adjuvant chemotherapy but Oncotype will also shared some information on prognosis.  She will return to clinic after surgery to review final pathology.  If she is not willing to proceed with radiation, she will start antiestrogen therapy right away.  If she is willing to proceed with radiation, she will start antiestrogen therapy after completion of radiation.         All questions were answered. The patient knows to call the clinic with any problems, questions or concerns.    Benay Pike, MD 06/06/22

## 2022-06-09 ENCOUNTER — Encounter: Payer: Self-pay | Admitting: *Deleted

## 2022-06-09 ENCOUNTER — Telehealth: Payer: Self-pay | Admitting: *Deleted

## 2022-06-09 NOTE — Telephone Encounter (Signed)
Spoke with patient to follow up from new patient appt and assess navigation needs and give contact information.  Patient denies any questions or concerns at this time. Encouraged her to call should anything arise. Patient verbalized understanding.

## 2022-06-13 ENCOUNTER — Inpatient Hospital Stay: Payer: Medicare Other | Admitting: Licensed Clinical Social Worker

## 2022-06-13 NOTE — Progress Notes (Signed)
[place] Work  Clinical Social Work was referred by  new patient protocol  for assessment of psychosocial needs.  Clinical Social Worker contacted patient by phone  to offer support and assess for needs.  Patient reports doing well overall currently. She felt numb and then went through different emotions but feels optimistic at this time. She has strong support from her husband and family. No resource needs at this time.  CSW and patient discussed common feeling and emotions when being diagnosed with cancer, and the importance of support during treatment.  CSW informed patient of the support team and support services at [facility].  CSW provided contact information and encouraged patient to call with any questions or concerns.      [place], [place] Worker [employer]

## 2022-06-19 ENCOUNTER — Encounter (HOSPITAL_BASED_OUTPATIENT_CLINIC_OR_DEPARTMENT_OTHER): Payer: Self-pay | Admitting: Surgery

## 2022-06-19 ENCOUNTER — Other Ambulatory Visit: Payer: Self-pay

## 2022-06-25 ENCOUNTER — Ambulatory Visit
Admission: RE | Admit: 2022-06-25 | Discharge: 2022-06-25 | Disposition: A | Discharge status: Home / Self Care | Payer: Medicare Other | Source: Ambulatory Visit | Attending: Surgery | Admitting: Surgery

## 2022-06-25 DIAGNOSIS — C50912 Malignant neoplasm of unspecified site of left female breast: Secondary | ICD-10-CM

## 2022-06-25 NOTE — Progress Notes (Signed)

## 2022-06-26 ENCOUNTER — Other Ambulatory Visit: Payer: Self-pay

## 2022-06-26 ENCOUNTER — Ambulatory Visit (HOSPITAL_BASED_OUTPATIENT_CLINIC_OR_DEPARTMENT_OTHER): Payer: Medicare Other | Admitting: Anesthesiology

## 2022-06-26 ENCOUNTER — Ambulatory Visit
Admission: RE | Admit: 2022-06-26 | Discharge: 2022-06-26 | Disposition: A | Discharge status: Home / Self Care | Payer: Medicare Other | Source: Ambulatory Visit | Attending: Surgery | Admitting: Surgery

## 2022-06-26 ENCOUNTER — Ambulatory Visit (HOSPITAL_BASED_OUTPATIENT_CLINIC_OR_DEPARTMENT_OTHER)
Admission: RE | Admit: 2022-06-26 | Discharge: 2022-06-26 | Disposition: A | Discharge status: Home / Self Care | Payer: Medicare Other | Attending: Surgery | Admitting: Surgery

## 2022-06-26 ENCOUNTER — Encounter (HOSPITAL_BASED_OUTPATIENT_CLINIC_OR_DEPARTMENT_OTHER)
Admission: RE | Disposition: A | Discharge status: Home / Self Care | Payer: Self-pay | Source: Home / Self Care | Attending: Surgery

## 2022-06-26 ENCOUNTER — Encounter (HOSPITAL_BASED_OUTPATIENT_CLINIC_OR_DEPARTMENT_OTHER): Payer: Self-pay | Admitting: Surgery

## 2022-06-26 DIAGNOSIS — C50812 Malignant neoplasm of overlapping sites of left female breast: Secondary | ICD-10-CM | POA: Diagnosis present

## 2022-06-26 DIAGNOSIS — E039 Hypothyroidism, unspecified: Secondary | ICD-10-CM | POA: Insufficient documentation

## 2022-06-26 DIAGNOSIS — Z87891 Personal history of nicotine dependence: Secondary | ICD-10-CM | POA: Insufficient documentation

## 2022-06-26 DIAGNOSIS — Z803 Family history of malignant neoplasm of breast: Secondary | ICD-10-CM | POA: Insufficient documentation

## 2022-06-26 DIAGNOSIS — Z170 Estrogen receptor positive status [ER+]: Secondary | ICD-10-CM | POA: Insufficient documentation

## 2022-06-26 DIAGNOSIS — C50912 Malignant neoplasm of unspecified site of left female breast: Secondary | ICD-10-CM

## 2022-06-26 DIAGNOSIS — Z01818 Encounter for other preprocedural examination: Secondary | ICD-10-CM

## 2022-06-26 HISTORY — PX: BREAST LUMPECTOMY WITH RADIOACTIVE SEED LOCALIZATION: SHX6424

## 2022-06-26 HISTORY — DX: Other specified postprocedural states: Z98.890

## 2022-06-26 HISTORY — DX: Unspecified osteoarthritis, unspecified site: M19.90

## 2022-06-26 SURGERY — BREAST LUMPECTOMY WITH RADIOACTIVE SEED LOCALIZATION
Anesthesia: General | Site: Breast | Laterality: Left

## 2022-06-26 MED ORDER — LIDOCAINE 2% (20 MG/ML) 5 ML SYRINGE
INTRAMUSCULAR | Status: AC
Start: 2022-06-26 — End: ?
  Filled 2022-06-26: qty 5

## 2022-06-26 MED ORDER — DEXAMETHASONE SODIUM PHOSPHATE 4 MG/ML IJ SOLN
INTRAMUSCULAR | Status: DC | PRN
  Administered 2022-06-26: 10 mg via INTRAVENOUS

## 2022-06-26 MED ORDER — ONDANSETRON HCL 4 MG/2ML IJ SOLN
INTRAMUSCULAR | Status: AC
  Filled 2022-06-26: qty 2

## 2022-06-26 MED ORDER — OXYCODONE HCL 5 MG/5ML PO SOLN: 5.0000 mg | Freq: Once | ORAL | Status: DC | PRN

## 2022-06-26 MED ORDER — OXYCODONE HCL 5 MG PO TABS: 5.0000 mg | ORAL_TABLET | Freq: Once | ORAL | Status: DC | PRN

## 2022-06-26 MED ORDER — OXYCODONE HCL 5 MG PO TABS: 5.0000 mg | ORAL_TABLET | Freq: Four times a day (QID) | ORAL | 0 refills | Status: DC | PRN

## 2022-06-26 MED ORDER — GABAPENTIN 300 MG PO CAPS
ORAL_CAPSULE | ORAL | Status: AC
Start: 2022-06-26 — End: ?
  Filled 2022-06-26: qty 1

## 2022-06-26 MED ORDER — FENTANYL CITRATE (PF) 100 MCG/2ML IJ SOLN: 25.0000 ug | INTRAMUSCULAR | Status: DC | PRN

## 2022-06-26 MED ORDER — PROPOFOL 10 MG/ML IV BOLUS
INTRAVENOUS | Status: DC | PRN
  Administered 2022-06-26: 120 mg via INTRAVENOUS

## 2022-06-26 MED ORDER — BUPIVACAINE-EPINEPHRINE (PF) 0.25% -1:200000 IJ SOLN
INTRAMUSCULAR | Status: DC | PRN
  Administered 2022-06-26: 10 mL via PERINEURAL

## 2022-06-26 MED ORDER — CEFAZOLIN IN SODIUM CHLORIDE 3-0.9 GM/100ML-% IV SOLN
3.0000 g | INTRAVENOUS | Status: AC
  Administered 2022-06-26: 2 g via INTRAVENOUS

## 2022-06-26 MED ORDER — ACETAMINOPHEN 500 MG PO TABS
ORAL_TABLET | ORAL | Status: AC
  Filled 2022-06-26: qty 2

## 2022-06-26 MED ORDER — ONDANSETRON HCL 4 MG/2ML IJ SOLN: 4.0000 mg | Freq: Four times a day (QID) | INTRAMUSCULAR | Status: DC | PRN

## 2022-06-26 MED ORDER — EPHEDRINE SULFATE (PRESSORS) 50 MG/ML IJ SOLN
INTRAMUSCULAR | Status: DC | PRN
  Administered 2022-06-26: 10 mg via INTRAVENOUS

## 2022-06-26 MED ORDER — CHLORHEXIDINE GLUCONATE CLOTH 2 % EX PADS: 6.0000 | MEDICATED_PAD | Freq: Once | CUTANEOUS | Status: DC

## 2022-06-26 MED ORDER — LACTATED RINGERS IV SOLN: INTRAVENOUS | Status: DC

## 2022-06-26 MED ORDER — ONDANSETRON HCL 4 MG/2ML IJ SOLN
INTRAMUSCULAR | Status: DC | PRN
  Administered 2022-06-26: 4 mg via INTRAVENOUS

## 2022-06-26 MED ORDER — FENTANYL CITRATE (PF) 100 MCG/2ML IJ SOLN
INTRAMUSCULAR | Status: AC
  Filled 2022-06-26: qty 2

## 2022-06-26 MED ORDER — PROPOFOL 10 MG/ML IV BOLUS
INTRAVENOUS | Status: AC
  Filled 2022-06-26: qty 20

## 2022-06-26 MED ORDER — FENTANYL CITRATE (PF) 100 MCG/2ML IJ SOLN
INTRAMUSCULAR | Status: DC | PRN
  Administered 2022-06-26: 50 ug via INTRAVENOUS

## 2022-06-26 MED ORDER — DEXAMETHASONE SODIUM PHOSPHATE 10 MG/ML IJ SOLN
INTRAMUSCULAR | Status: AC
  Filled 2022-06-26: qty 1

## 2022-06-26 MED ORDER — GABAPENTIN 300 MG PO CAPS
300.0000 mg | ORAL_CAPSULE | ORAL | Status: AC
  Administered 2022-06-26: 300 mg via ORAL

## 2022-06-26 MED ORDER — CEFAZOLIN SODIUM-DEXTROSE 2-4 GM/100ML-% IV SOLN
INTRAVENOUS | Status: AC
  Filled 2022-06-26: qty 100

## 2022-06-26 MED ORDER — ACETAMINOPHEN 500 MG PO TABS
1000.0000 mg | ORAL_TABLET | ORAL | Status: AC
  Administered 2022-06-26: 1000 mg via ORAL

## 2022-06-26 MED ORDER — SODIUM CHLORIDE 0.9 % IV SOLN
INTRAVENOUS | Status: DC | PRN
  Administered 2022-06-26: 100 mL

## 2022-06-26 SURGICAL SUPPLY — 51 items
ADH SKN CLS APL DERMABOND .7 (GAUZE/BANDAGES/DRESSINGS) ×1
APL PRP STRL LF DISP 70% ISPRP (MISCELLANEOUS) ×1
APPLIER CLIP 9.375 MED OPEN (MISCELLANEOUS)
APR CLP MED 9.3 20 MLT OPN (MISCELLANEOUS)
BINDER BREAST LRG (GAUZE/BANDAGES/DRESSINGS) IMPLANT
BINDER BREAST MEDIUM (GAUZE/BANDAGES/DRESSINGS) IMPLANT
BINDER BREAST XLRG (GAUZE/BANDAGES/DRESSINGS) IMPLANT
BINDER BREAST XXLRG (GAUZE/BANDAGES/DRESSINGS) IMPLANT
BLADE SURG 15 STRL LF DISP TIS (BLADE) ×1 IMPLANT
BLADE SURG 15 STRL SS (BLADE) ×2
CANISTER SUC SOCK COL 7IN (MISCELLANEOUS) IMPLANT
CANISTER SUCT 1200ML W/VALVE (MISCELLANEOUS) IMPLANT
CHLORAPREP W/TINT 26 (MISCELLANEOUS) ×2 IMPLANT
CLIP APPLIE 9.375 MED OPEN (MISCELLANEOUS) IMPLANT
COVER BACK TABLE 60X90IN (DRAPES) ×2 IMPLANT
COVER MAYO STAND STRL (DRAPES) ×2 IMPLANT
COVER PROBE W GEL 5X96 (DRAPES) ×2 IMPLANT
DERMABOND ADVANCED (GAUZE/BANDAGES/DRESSINGS) ×1
DERMABOND ADVANCED .7 DNX12 (GAUZE/BANDAGES/DRESSINGS) ×1 IMPLANT
DRAPE LAPAROSCOPIC ABDOMINAL (DRAPES) IMPLANT
DRAPE LAPAROTOMY 100X72 PEDS (DRAPES) ×2 IMPLANT
DRAPE UTILITY XL STRL (DRAPES) ×2 IMPLANT
ELECT COATED BLADE 2.86 ST (ELECTRODE) ×2 IMPLANT
ELECT REM PT RETURN 9FT ADLT (ELECTROSURGICAL) ×2
ELECTRODE REM PT RTRN 9FT ADLT (ELECTROSURGICAL) ×1 IMPLANT
GLOVE BIOGEL PI IND STRL 8 (GLOVE) ×1 IMPLANT
GLOVE BIOGEL PI INDICATOR 8 (GLOVE) ×1
GLOVE ECLIPSE 8.0 STRL XLNG CF (GLOVE) ×2 IMPLANT
GOWN STRL REUS W/ TWL LRG LVL3 (GOWN DISPOSABLE) ×2 IMPLANT
GOWN STRL REUS W/ TWL XL LVL3 (GOWN DISPOSABLE) ×1 IMPLANT
GOWN STRL REUS W/TWL LRG LVL3 (GOWN DISPOSABLE) ×4
GOWN STRL REUS W/TWL XL LVL3 (GOWN DISPOSABLE) ×2
HEMOSTAT ARISTA ABSORB 3G PWDR (HEMOSTASIS) IMPLANT
HEMOSTAT SNOW SURGICEL 2X4 (HEMOSTASIS) IMPLANT
KIT MARKER MARGIN INK (KITS) ×2 IMPLANT
NDL HYPO 25X1 1.5 SAFETY (NEEDLE) ×1 IMPLANT
NEEDLE HYPO 25X1 1.5 SAFETY (NEEDLE) ×2 IMPLANT
NS IRRIG 1000ML POUR BTL (IV SOLUTION) ×2 IMPLANT
PACK BASIN DAY SURGERY FS (CUSTOM PROCEDURE TRAY) ×2 IMPLANT
PENCIL SMOKE EVACUATOR (MISCELLANEOUS) ×2 IMPLANT
SLEEVE SCD COMPRESS KNEE MED (STOCKING) ×2 IMPLANT
SPIKE FLUID TRANSFER (MISCELLANEOUS) IMPLANT
SPONGE T-LAP 4X18 ~~LOC~~+RFID (SPONGE) ×2 IMPLANT
SUT MNCRL AB 4-0 PS2 18 (SUTURE) ×2 IMPLANT
SUT SILK 2 0 SH (SUTURE) IMPLANT
SUT VICRYL 3-0 CR8 SH (SUTURE) ×2 IMPLANT
SYR CONTROL 10ML LL (SYRINGE) ×2 IMPLANT
TOWEL GREEN STERILE FF (TOWEL DISPOSABLE) ×2 IMPLANT
TRAY FAXITRON CT DISP (TRAY / TRAY PROCEDURE) ×2 IMPLANT
TUBE CONNECTING 20X1/4 (TUBING) IMPLANT
YANKAUER SUCT BULB TIP NO VENT (SUCTIONS) IMPLANT

## 2022-06-26 NOTE — Anesthesia Postprocedure Evaluation (Signed)
Anesthesia Post Note  Patient: Kristin Molina  Procedure(s) Performed: LEFT BREAST LUMPECTOMY WITH RADIOACTIVE SEED LOCALIZATION (Left: Breast)     Patient location during evaluation: PACU Anesthesia Type: General Level of consciousness: awake and alert Pain management: pain level controlled Vital Signs Assessment: post-procedure vital signs reviewed and stable Respiratory status: spontaneous breathing, nonlabored ventilation, respiratory function stable and patient connected to nasal cannula oxygen Cardiovascular status: blood pressure returned to baseline and stable Postop Assessment: no apparent nausea or vomiting Anesthetic complications: no   No notable events documented.  Last Vitals:  Vitals:   06/26/22 1615 06/26/22 1630  BP: (!) 116/58 131/70  Pulse: 82 99  Resp: 10 16  Temp:    SpO2: 96% 95%    Last Pain:  Vitals:   06/26/22 1642  TempSrc:   PainSc: 0-No pain                 Nyron Mozer S

## 2022-06-26 NOTE — Op Note (Addendum)
Preoperative diagnosis: Stage I left breast cancer overlapping sites  Postoperative diagnosis: Same  Procedure: Left breast seed localized lumpectomy  Surgeon: Erroll Luna, MD  Assistant: Dr Clerance Lav MD   I was personally present and performed  the key and critical portions of this procedure and immediately available throughout the entire procedure, as documented in my operative note.      Anesthesia: LMA with 0.25% Sensorcaine with epinephrine  EBL: Minimal  Specimen: Left breast tissue with seed and clip verified by Faxitron plus additional deep margin which includes fibers from the pectoralis major muscle.  Drains: None  Indications for procedure: The patient is a 76 year old female with stage I left breast cancer.  She had a 3 mm mass detected by mammography.  Additional core biopsy showed fibrocystic changes more superior to that.  She presents for breast conserving surgery.  We discussed sentinel lymph node mapping as well but opted out of that due to the discussion the pros and cons of long-term expectations and the potential complications of the procedure.The procedure has been discussed with the patient. Alternatives to surgery have been discussed with the patient.  Risks of surgery include bleeding,  Infection,  Seroma formation, death,  and the need for further surgery.   The patient understands and wishes to proceed.     Description of procedure: The patient was met in the holding area and questions were answered.  Left breast was marked as correct site and films were available for review.  She underwent seed placement as an outpatient.  She was then taken back to the operating room.  She was placed upon upon the OR table.  The left breast was prepped and draped in sterile fashion timeout performed.  Proper patient, site and procedure verified.  Neoprobe used to identify the seed in the left central breast.  Local anesthetic was infiltrated along the lateral  border of the nipple areolar complex.  Curvilinear incision made around the lateral border of the nipple areolar complex.  Dissection was carried down and all tissue around the seed and clip were excised with a grossly negative margin.  Deep margin was taken though down to the muscle to ensure clear margins cyst appeared to be grossly the closest.  Irrigation was used.  Local anesthetic infiltrated around the cavity.  There is no bleeding.  The wound was closed with 3-0 Vicryl and 4 Monocryl.  Dermabond applied.  Breast binder placed.  All counts found to be correct.  The patient was awoke extubated taken to recovery in satisfactory condition.

## 2022-06-26 NOTE — H&P (Signed)
History of Present Illness: Kristin Molina is a 76 y.o. female who is seen today as an office consultation for evaluation of New Patient (Left breast ca ) .   Patient presents for evaluation of left breast cancer. She was found on screening and subsequent diagnostic mammography to have a 3 mm mass left breast upper outer quadrant to central region core biopsy proven to be grade 1 invasive ductal carcinoma ER positive PR positive HER2/neu negative with AKI 67 of 1%. She has no complaints. Mother had breast cancer at age 41.   Review of Systems: A complete review of systems was obtained from the patient. I have reviewed this information and discussed as appropriate with the patient. See HPI as well for other ROS.    Medical History: Past Medical History:  Diagnosis Date  Arthritis  Thyroid disease   There is no problem list on file for this patient.  History reviewed. No pertinent surgical history.   No Known Allergies  Current Outpatient Medications on File Prior to Visit  Medication Sig Dispense Refill  alendronate (FOSAMAX) 70 MG tablet Take by mouth every 7 (seven) days  levothyroxine (SYNTHROID) 75 MCG tablet Take by mouth  lovastatin (MEVACOR) 40 MG tablet Take by mouth  aspirin 81 MG EC tablet Take by mouth   No current facility-administered medications on file prior to visit.   Family History  Problem Relation Age of Onset  Diabetes Mother  Breast cancer Mother    Social History   Tobacco Use  Smoking Status Former  Types: Cigarettes  Quit date: 1979  Years since quitting: 44.4  Smokeless Tobacco Never    Social History   Socioeconomic History  Marital status: Married  Tobacco Use  Smoking status: Former  Types: Cigarettes  Quit date: 1979  Years since quitting: 44.4  Smokeless tobacco: Never  Substance and Sexual Activity  Alcohol use: Yes  Comment: 3 x a week  Drug use: Never   Objective:   Vitals:  05/20/22 0956  BP: 132/78  Pulse: 84   Temp: 36.6 C (97.8 F)  SpO2: 99%  Weight: 58.1 kg (128 lb)  Height: 165.1 cm (5' 5" )   Body mass index is 21.3 kg/m.  Physical Exam HENT:  Head: Normocephalic.  Eyes:  Pupils: Pupils are equal, round, and reactive to light.  Cardiovascular:  Rate and Rhythm: Normal rate.  Pulmonary:  Effort: Pulmonary effort is normal.  Chest:  Breasts: Right: Normal.  Left: Normal.  Musculoskeletal:  General: Normal range of motion.  Lymphadenopathy:  Upper Body:  Right upper body: No supraclavicular or axillary adenopathy.  Left upper body: No supraclavicular or axillary adenopathy.  Skin: General: Skin is warm.  Neurological:  General: No focal deficit present.  Mental Status: She is alert.  Psychiatric:  Mood and Affect: Mood normal.  Behavior: Behavior normal.    Labs, Imaging and Diagnostic Testing: ADDITIONAL INFORMATION: FLUORESCENCE IN-SITU HYBRIDIZATION Results: GROUP 5: HER2 **NEGATIVE** Equivocal form of amplification of the HER2 gene was detected in the IHC 2+ tissue sample received from this individual. HER2 FISH was performed by a technologist and cell imaging and analysis on the [employer]. RATIO OF HER2/CEN17 SIGNALS 1.03 AVERAGE HER2 COPY NUMBER PER CELL 1.95 The ratio of HER2/CEN 17 is within the range < 2.0 of HER2/CEN 17 and a copy number of HER2 signals per cell is <4.0. Arch Pathol Lab Med 1:1,2018 Casimer Lanius MD Pathologist, Electronic Signature ( Signed 05/09/2022) PROGNOSTIC INDICATORS Results: IMMUNOHISTOCHEMICAL AND MORPHOMETRIC ANALYSIS PERFORMED MANUALLY The  tumor cells are EQUIVOCAL for Her2 (2+). Her2 by FISH will be performed and the results reported separately. Estrogen Receptor: 95%, POSITIVE, STRONG STAINING INTENSITY Progesterone Receptor: 90%, POSITIVE, STRONG STAINING INTENSITY Proliferation Marker Ki67: 1% REFERENCE RANGE ESTROGEN RECEPTOR NEGATIVE 0% POSITIVE =>1% REFERENCE RANGE PROGESTERONE RECEPTOR 1 of 3 FINAL for Sliva,  [place]ah E ([id number]) ADDITIONAL INFORMATION:(continued) NEGATIVE 0% POSITIVE =>1% All controls stained appropriately Casimer Lanius MD Pathologist, Electronic Signature ( Signed 05/06/2022) FINAL DIAGNOSIS Diagnosis Breast, left, needle core biopsy, lateral/posterior, x shaped clip - INVASIVE DUCTAL CARCINOMA, GRADE 1. - DUCTAL CARCINOMA IN SITU. - SEE NOTE. Diagnosis Note The greatest tumor dimension is 0.3 cm. The DCIS has low to intermediate nuclear grade. Immunohistochemistry for basal cell markers (p63, calponin and smooth muscle myosin) supports the diagnosis. Breast prognostic markers will be performed. Dr. Spero Curb agrees. Called to the [facility] on 05/05/2022 and 05/06/2022. Claudette Laws MD Pathologist, Electronic Signature (Case signed 05/06/2022) Specimen Gross and Clinical Information Specimen Comment TIF: 11:45, CIT: < 5 min, mass Specimen(s) Obtained: Breast, left, needle core biopsy, lateral/posterior, x shaped clip Specimen Clinical  CLINICAL DATA: 76 year old female presenting as a recall from screening for possible left breast mass.  EXAM: DIGITAL DIAGNOSTIC UNILATERAL LEFT MAMMOGRAM WITH TOMOSYNTHESIS AND CAD; ULTRASOUND LEFT BREAST LIMITED  TECHNIQUE: Left digital diagnostic mammography and breast tomosynthesis was performed. The images were evaluated with computer-aided detection.; Targeted ultrasound examination of the left breast was performed.  COMPARISON: Previous exam(s).  ACR Breast Density Category b: There are scattered areas of fibroglandular density.  FINDINGS: Mammogram:  Left breast: Spot compression tomosynthesis views and full field CC rolled and true lateral views were performed. There is persistence of a tiny 3 mm mass with possible subtle distortion in the central posterior left breast.  Ultrasound:  Targeted ultrasound performed throughout the retroareolar aspect of the left breast demonstrating a likely  incidental tiny cyst at 12 o'clock retroareolar. No additional cystic or solid mass identified to correspond to the mammographic finding.  Targeted ultrasound of the left axilla demonstrates normal lymph nodes.  IMPRESSION: Indeterminate tiny suspicious mass without sonographic correlate in the central left breast.  RECOMMENDATION: Stereotactic core needle biopsy of the left breast x1.  I have discussed the findings and recommendations with the patient who agrees to proceed with biopsy. The patient will be scheduled for the biopsy appointment prior to leaving the office today.  BI-RADS CATEGORY 4: Suspicious.   Electronically Signed By: Audie Pinto M.D. On: 04/23/2022 16:35  Assessment and Plan:   Diagnoses and all orders for this visit:  Malignant neoplasm of upper-outer quadrant of left breast in female, estrogen receptor positive (CMS-HCC)    Long discussion of surgical treatment options for her stage I breast cancer. Discussed breast conserving surgery versus mastectomy with reconstruction. Long-term survival, local regional recurrence and additional adjuvant therapies discussed with her today. She has a very small tumor and would be a good candidate for simple left breast lumpectomy. Discussed omission of sentinel node and reviewed the data with fat and her husband today. Discussed risk of lymphedema and chronic pain from this. Risk of recurrence was also discussed as well as lymph node positivity of 8% or less. She would like to omit the lymph node mapping prior to procedure after we discussed all this today. Plan will be for left seed localized lumpectomy. Referral to medical and radiation oncology for consideration of genetics.  Discussed the risk of surgery to include but not exclusive of bleeding, infection, cosmetic deformity,  pain, seroma, swelling, and the need for reexcision of surgery if necessary.  No follow-ups on file.  Kennieth Francois, MD

## 2022-06-26 NOTE — Anesthesia Procedure Notes (Signed)
Procedure Name: LMA Insertion Date/Time: 06/26/2022 3:36 PM  Performed by: Verita Lamb, CRNAPre-anesthesia Checklist: Patient identified, Emergency Drugs available, Suction available and Patient being monitored Patient Re-evaluated:Patient Re-evaluated prior to induction Oxygen Delivery Method: Circle system utilized Preoxygenation: Pre-oxygenation with 100% oxygen Induction Type: IV induction Ventilation: Mask ventilation without difficulty LMA: LMA inserted LMA Size: 3.0 Number of attempts: 1 Airway Equipment and Method: Bite block Placement Confirmation: positive ETCO2, CO2 detector and breath sounds checked- equal and bilateral Tube secured with: Tape Dental Injury: Teeth and Oropharynx as per pre-operative assessment

## 2022-06-26 NOTE — Interval H&P Note (Signed)
History and Physical Interval Note:  06/26/2022 2:32 PM  Kristin Molina  has presented today for surgery, with the diagnosis of LEFT BREAST CANCER.  The various methods of treatment have been discussed with the patient and family. After consideration of risks, benefits and other options for treatment, the patient has consented to  Procedure(s): LEFT BREAST LUMPECTOMY WITH RADIOACTIVE SEED LOCALIZATION (Left) as a surgical intervention.  The patient's history has been reviewed, patient examined, no change in status, stable for surgery.  I have reviewed the patient's chart and labs.  Questions were answered to the patient's satisfaction.   The procedure has been discussed with the patient. Alternatives to surgery have been discussed with the patient.  Risks of surgery include bleeding,  Infection,  Seroma formation, death,  and the need for further surgery.   The patient understands and wishes to proceed.   Turner Daniels MD

## 2022-06-26 NOTE — Discharge Instructions (Addendum)
[facility] Office Phone Number [phone]  BREAST BIOPSY/ PARTIAL MASTECTOMY: POST OP INSTRUCTIONS  Always review your discharge instruction sheet given to you by the facility where your surgery was performed.  IF YOU HAVE DISABILITY OR FAMILY LEAVE FORMS, YOU MUST BRING THEM TO THE OFFICE FOR PROCESSING.  DO NOT GIVE THEM TO YOUR DOCTOR.  A prescription for pain medication may be given to you upon discharge.  Take your pain medication as prescribed, if needed.  If narcotic pain medicine is not needed, then you may take acetaminophen (Tylenol) or ibuprofen (Advil) as needed. Take your usually prescribed medications unless otherwise directed If you need a refill on your pain medication, please contact your pharmacy.  They will contact our office to request authorization.  Prescriptions will not be filled after 5pm or on week-ends. You should eat very light the first 24 hours after surgery, such as soup, crackers, pudding, etc.  Resume your normal diet the day after surgery. Most patients will experience some swelling and bruising in the breast.  Ice packs and a good support bra will help.  Swelling and bruising can take several days to resolve.  It is common to experience some constipation if taking pain medication after surgery.  Increasing fluid intake and taking a stool softener will usually help or prevent this problem from occurring.  A mild laxative (Milk of Magnesia or Miralax) should be taken according to package directions if there are no bowel movements after 48 hours. Unless discharge instructions indicate otherwise, you may remove your bandages 24-48 hours after surgery, and you may shower at that time.  You may have steri-strips (small skin tapes) in place directly over the incision.  These strips should be left on the skin for 7-10 days.  If your surgeon used skin glue on the incision, you may shower in 24 hours.  The glue will flake off over the next 2-3 weeks.  Any  sutures or staples will be removed at the office during your follow-up visit. ACTIVITIES:  You may resume regular daily activities (gradually increasing) beginning the next day.  Wearing a good support bra or sports bra minimizes pain and swelling.  You may have sexual intercourse when it is comfortable. You may drive when you no longer are taking prescription pain medication, you can comfortably wear a seatbelt, and you can safely maneuver your car and apply brakes. RETURN TO WORK:  ______________________________________________________________________________________ Dennis Bast should see your doctor in the office for a follow-up appointment approximately two weeks after your surgery.  Your doctor's nurse will typically make your follow-up appointment when she calls you with your pathology report.  Expect your pathology report 2-3 business days after your surgery.  You may call to check if you do not hear from [place] after three days. OTHER INSTRUCTIONS: _______________________________________________________________________________________________ _____________________________________________________________________________________________________________________________________ _____________________________________________________________________________________________________________________________________ _____________________________________________________________________________________________________________________________________  WHEN TO CALL YOUR DOCTOR: Fever over 101.0 Nausea and/or vomiting. Extreme swelling or bruising. Continued bleeding from incision. Increased pain, redness, or drainage from the incision.  The clinic staff is available to answer your questions during regular business hours.  Please don't hesitate to call and ask to speak to one of the nurses for clinical concerns.  If you have a medical emergency, go to the nearest emergency room or call 911.  A surgeon from [facility] is always on call at the hospital.  For further questions, please visit [email or web address]    No Tylenol before 7pm tonight.   Post Anesthesia Home Care Instructions  Activity: Get  plenty of rest for the remainder of the day. A responsible individual must stay with you for 24 hours following the procedure.  For the next 24 hours, DO NOT: -Drive a car -Paediatric nurse -Drink alcoholic beverages -Take any medication unless instructed by your physician -Make any legal decisions or sign important papers.  Meals: Start with liquid foods such as gelatin or soup. Progress to regular foods as tolerated. Avoid greasy, spicy, heavy foods. If nausea and/or vomiting occur, drink only clear liquids until the nausea and/or vomiting subsides. Call your physician if vomiting continues.  Special Instructions/Symptoms: Your throat may feel dry or sore from the anesthesia or the breathing tube placed in your throat during surgery. If this causes discomfort, gargle with warm salt water. The discomfort should disappear within 24 hours.  If you had a scopolamine patch placed behind your ear for the management of post- operative nausea and/or vomiting:  1. The medication in the patch is effective for 72 hours, after which it should be removed.  Wrap patch in a tissue and discard in the trash. Wash hands thoroughly with soap and water. 2. You may remove the patch earlier than 72 hours if you experience unpleasant side effects which may include dry mouth, dizziness or visual disturbances. 3. Avoid touching the patch. Wash your hands with soap and water after contact with the patch.

## 2022-06-26 NOTE — Transfer of Care (Signed)
Immediate Anesthesia Transfer of Care Note  Patient: Emilyann Banka Drier  Procedure(s) Performed: LEFT BREAST LUMPECTOMY WITH RADIOACTIVE SEED LOCALIZATION (Left: Breast)  Patient Location: PACU  Anesthesia Type:General  Level of Consciousness: awake, drowsy and patient cooperative  Airway & Oxygen Therapy: Patient Spontanous Breathing and Patient connected to face mask oxygen  Post-op Assessment: Report given to RN and Post -op Vital signs reviewed and stable  Post vital signs: Reviewed and stable  Last Vitals:  Vitals Value Taken Time  BP    Temp    Pulse 82 06/26/22 1611  Resp    SpO2 98 % 06/26/22 1611  Vitals shown include unvalidated device data.  Last Pain:  Vitals:   06/26/22 1246  TempSrc: Oral  PainSc: 0-No pain         Complications: No notable events documented.

## 2022-06-26 NOTE — Anesthesia Preprocedure Evaluation (Signed)
Anesthesia Evaluation  Patient identified by MRN, date of birth, ID band Patient awake    Reviewed: Allergy & Precautions, H&P , NPO status , Patient's Chart, lab work & pertinent test results  History of Anesthesia Complications (+) PONV and history of anesthetic complications  Airway Mallampati: II   Neck ROM: full    Dental   Pulmonary former smoker,    breath sounds clear to auscultation       Cardiovascular negative cardio ROS   Rhythm:regular Rate:Normal     Neuro/Psych    GI/Hepatic   Endo/Other  Hypothyroidism   Renal/GU      Musculoskeletal  (+) Arthritis ,   Abdominal   Peds  Hematology   Anesthesia Other Findings   Reproductive/Obstetrics Breast CA                             Anesthesia Physical Anesthesia Plan  ASA: 2  Anesthesia Plan: General   Post-op Pain Management:    Induction: Intravenous  PONV Risk Score and Plan: 4 or greater and Ondansetron, Dexamethasone and Treatment may vary due to age or medical condition  Airway Management Planned: LMA  Additional Equipment:   Intra-op Plan:   Post-operative Plan: Extubation in OR  Informed Consent: I have reviewed the patients History and Physical, chart, labs and discussed the procedure including the risks, benefits and alternatives for the proposed anesthesia with the patient or authorized representative who has indicated his/her understanding and acceptance.     Dental advisory given  Plan Discussed with: CRNA, Anesthesiologist and Surgeon  Anesthesia Plan Comments:         Anesthesia Quick Evaluation

## 2022-06-27 ENCOUNTER — Encounter (HOSPITAL_BASED_OUTPATIENT_CLINIC_OR_DEPARTMENT_OTHER): Payer: Self-pay | Admitting: Surgery

## 2022-07-01 ENCOUNTER — Encounter: Payer: Self-pay | Admitting: Surgery

## 2022-07-01 LAB — SURGICAL PATHOLOGY

## 2022-07-02 ENCOUNTER — Encounter (HOSPITAL_COMMUNITY): Payer: Self-pay

## 2022-07-02 ENCOUNTER — Encounter: Payer: Self-pay | Admitting: *Deleted

## 2022-07-02 DIAGNOSIS — Z170 Estrogen receptor positive status [ER+]: Secondary | ICD-10-CM

## 2022-07-10 ENCOUNTER — Encounter: Payer: Self-pay | Admitting: *Deleted

## 2022-07-14 ENCOUNTER — Other Ambulatory Visit: Payer: Self-pay

## 2022-07-14 ENCOUNTER — Inpatient Hospital Stay: Payer: Medicare Other | Attending: Hematology and Oncology | Admitting: Hematology and Oncology

## 2022-07-14 ENCOUNTER — Encounter: Payer: Self-pay | Admitting: Hematology and Oncology

## 2022-07-14 DIAGNOSIS — C50512 Malignant neoplasm of lower-outer quadrant of left female breast: Secondary | ICD-10-CM | POA: Diagnosis present

## 2022-07-14 DIAGNOSIS — Z7982 Long term (current) use of aspirin: Secondary | ICD-10-CM | POA: Insufficient documentation

## 2022-07-14 DIAGNOSIS — Z803 Family history of malignant neoplasm of breast: Secondary | ICD-10-CM | POA: Diagnosis not present

## 2022-07-14 DIAGNOSIS — Z79890 Hormone replacement therapy (postmenopausal): Secondary | ICD-10-CM | POA: Diagnosis not present

## 2022-07-14 DIAGNOSIS — Z87891 Personal history of nicotine dependence: Secondary | ICD-10-CM | POA: Diagnosis not present

## 2022-07-14 DIAGNOSIS — M810 Age-related osteoporosis without current pathological fracture: Secondary | ICD-10-CM | POA: Insufficient documentation

## 2022-07-14 DIAGNOSIS — Z800 Family history of malignant neoplasm of digestive organs: Secondary | ICD-10-CM | POA: Diagnosis not present

## 2022-07-14 DIAGNOSIS — Z170 Estrogen receptor positive status [ER+]: Secondary | ICD-10-CM | POA: Diagnosis not present

## 2022-07-14 DIAGNOSIS — E039 Hypothyroidism, unspecified: Secondary | ICD-10-CM | POA: Diagnosis not present

## 2022-07-14 DIAGNOSIS — M129 Arthropathy, unspecified: Secondary | ICD-10-CM | POA: Insufficient documentation

## 2022-07-14 NOTE — Progress Notes (Signed)
[facility] CONSULT NOTE  Patient Care Team: Nicholes Rough, PA-C as PCP - General (Physician Assistant) Rockwell Germany, RN as Oncology Nurse Navigator Mauro Kaufmann, RN as Oncology Nurse Navigator Benay Pike, MD as Consulting Physician (Hematology and Oncology) Eppie Gibson, MD as Attending Physician (Radiation Oncology) Erroll Luna, MD as Consulting Physician (General Surgery)  CHIEF COMPLAINTS/PURPOSE OF CONSULTATION:  Newly diagnosed breast cancer  HISTORY OF PRESENTING ILLNESS:  Kristin Molina 76 y.o. female is here because of recent diagnosis of left breast cancer.  I reviewed her records extensively and collaborated the history with the patient.  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  Diagnostic mammogram showed indeterminate findings suspicious mass without sonographic correlate in the central left breast.   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    Interval history [name] is  here for follow-up after recent lumpectomy.  She has been doing very well since surgery.  She is scheduled to follow-up with radiation oncology for adjuvant radiation.  Rest of the pertinent 10 point ROS reviewed and negative  MEDICAL HISTORY:  Past Medical History:  Diagnosis Date   Arthritis    hips, hands   Cancer ([place]) 05/2022   left breast IDC   Colon polyps    Hypothyroidism    Osteoporosis    PONV (postoperative nausea and vomiting)     SURGICAL HISTORY: Past Surgical History:  Procedure Laterality Date   BREAST LUMPECTOMY WITH RADIOACTIVE SEED LOCALIZATION Left 06/26/2022   Procedure: LEFT BREAST LUMPECTOMY WITH RADIOACTIVE SEED LOCALIZATION;  Surgeon: Erroll Luna, MD;  Location: [facility];  Service: General;  Laterality: Left;   CERVICAL LAMINECTOMY     TUBAL LIGATION      SOCIAL HISTORY: Social History   Socioeconomic History   Marital status: Married    Spouse name: Not on file   Number of children: Not on file   Years of education: Not on file   Highest education level: Not on file  Occupational History   Not on file  Tobacco Use   Smoking status: Former    Types: Cigarettes    Quit date: 1980    Years since quitting: 43.6   Smokeless tobacco: Never  Vaping Use   Vaping Use: Never used  Substance and Sexual Activity   Alcohol use: Yes    Comment: 1 drink daily   Drug use: Never   Sexual activity: Yes    Birth control/protection: Post-menopausal  Other Topics Concern   Not on file  Social History Narrative   Not on file   Social Determinants of Health   Financial Resource Strain: Low Risk  (  06/13/2022)   Overall Financial Resource Strain (CARDIA)    Difficulty of Paying Living Expenses: Not hard at all  Food Insecurity: No Food Insecurity (06/13/2022)   Hunger Vital Sign    Worried About Running Out of Food in the Last Year: Never true    Ran Out of Food in the Last Year: Never true  Transportation Needs: No Transportation  Needs (06/13/2022)   PRAPARE - Hydrologist (Medical): No    Lack of Transportation (Non-Medical): No  Physical Activity: Not on file  Stress: Not on file  Social Connections: Not on file  Intimate Partner Violence: Not on file    FAMILY HISTORY: Family History  Problem Relation Age of Onset   Breast cancer Mother    Diabetes Mother    Colon cancer Neg Hx    Esophageal cancer Neg Hx    Pancreatic cancer Neg Hx    Stomach cancer Neg Hx    Liver disease Neg Hx     ALLERGIES:  is allergic to dust mite extract.  MEDICATIONS:  Current Outpatient Medications  Medication Sig Dispense Refill   alendronate (FOSAMAX) 70 MG tablet Take 70 mg by mouth once a week.     aspirin EC 81 MG tablet Take 81 mg by mouth daily. Swallow whole.     Cholecalciferol (D3-1000) 25 MCG (1000 UT) capsule Take 1,000 Units by mouth at bedtime.     Cyanocobalamin ([employer] B12 QUICK DISSOLVE PO) Take 5,000 mcg by mouth daily.     levothyroxine (SYNTHROID) 75 MCG tablet Take 75 mcg by mouth daily.     lovastatin (MEVACOR) 40 MG tablet Take 40 mg by mouth at bedtime.     Magnesium 500 MG CAPS Take by mouth.     Multiple Vitamins-Minerals (MULTIVITAMIN ADULTS 50+ PO) Take 1 tablet by mouth daily.     Omega-3 Fatty Acids (FISH OIL) 1000 MG CAPS Take 1 capsule by mouth daily.     oxyCODONE (OXY IR/ROXICODONE) 5 MG immediate release tablet Take 1 tablet (5 mg total) by mouth every 6 (six) hours as needed for severe pain. 15 tablet 0   TURMERIC CURCUMIN PO Take 1 tablet by mouth daily.     No current facility-administered medications for this visit.    REVIEW OF SYSTEMS:   Constitutional: Denies fevers, chills or abnormal night sweats Eyes: Denies blurriness of vision, double vision or watery eyes Ears, nose, mouth, throat, and face: Denies mucositis or sore throat Respiratory: Denies cough, dyspnea or wheezes Cardiovascular: Denies palpitation, chest discomfort or lower extremity  swelling Gastrointestinal:  Denies nausea, heartburn or change in bowel habits Skin: Denies abnormal skin rashes Lymphatics: Denies new lymphadenopathy or easy bruising Neurological:Denies numbness, tingling or new weaknesses Behavioral/Psych: Mood is stable, no new changes  Breast: Denies any palpable lumps or discharge All other systems were reviewed with the patient and are negative.  PHYSICAL EXAMINATION: ECOG PERFORMANCE STATUS: 0 - Asymptomatic  Vitals:   07/14/22 1340  BP: (!) 124/59  Pulse: 74  Resp: 18  Temp: 97.7 F (36.5 C)  SpO2: 97%    Filed Weights   07/14/22 1340  Weight: 127 lb 6 oz (57.8 kg)    Physical exam deferred in lieu of counseling  LABORATORY DATA:  I have reviewed the data as listed No results found for: "WBC", "HGB", "HCT", "MCV", "PLT" No results found for: "NA", "K", "CL", "CO2"  RADIOGRAPHIC STUDIES: I have personally reviewed the radiological reports and agreed with the  findings in the report.  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  We have reviewed that this is a low-grade invasive ductal carcinoma measuring 3 mm, negative margins.  No sentinel lymph node submitted. We have reviewed role of adjuvant radiation and antiestrogen therapy.  She is currently leaning towards tamoxifen since she is worried about bone density loss with aromatase inhibitors.  I have however given her printed information about both tamoxifen and anastrozole for further review.  She should return to clinic after completion of adjuvant radiation.  We  have briefly discussed that the role of antiestrogen therapy is to reduce risk of recurrence in both breasts as well as to reduce risk of distant recurrence.  She expressed understanding of the recommendations.   Total time spent : 30 minutes in review of records, H and P, counseling and coordination of care. All questions were answered. The patient knows to call the clinic with any problems, questions or concerns.    Benay Pike, MD 07/14/22

## 2022-07-14 NOTE — Assessment & Plan Note (Signed)
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  We have reviewed that this is a low-grade invasive ductal carcinoma measuring 3 mm, negative margins.  No sentinel lymph node submitted. We have reviewed role of adjuvant radiation and antiestrogen therapy.  She is currently leaning towards tamoxifen since she is worried about bone density loss with aromatase inhibitors.  I have however given her printed information about both tamoxifen and anastrozole for further review.  She should return to clinic after completion of adjuvant radiation.  We have briefly discussed that the role of antiestrogen therapy is to reduce risk of recurrence in both breasts as well as to reduce risk of distant recurrence.  She expressed understanding of the recommendations.

## 2022-07-21 NOTE — Progress Notes (Signed)
Radiation Oncology         [phone] ________________________________  Name: MEAGAN ANCONA MRN: [id number]  Date: 07/22/2022  DOB: 1946-05-23  Follow-Up Visit Note  Outpatient  CC: Burr Medico, MD  Diagnosis:   No diagnosis found.   Stage I, Left Breast LOQ, Invasive Ductal Carcinoma, ER+ / PR+ / Her2-, Grade 1: s/p lumpectomy    Cancer Staging  No matching staging information was found for the patient.  CHIEF COMPLAINT: Here to discuss management of left breast cancer  Narrative:  The patient returns today for follow-up.     Since consultation date of 05/27/22, the patient opted to proceed with left breast lumpectomy without nodal biopsies on 06/26/22 under the care of Dr. Brantley Stage. Pathology from the procedure revealed: tumor size of 3 mm; histology of grade 1 invasive ductal carcinoma of no special type with microcalcifications and atypical ductal hyperplasia (negative for DCIS); all margins negative for invasive disease; final margin status to invasive disease of 9 mm from the posterior margin; no lymph nodes were examined;  ER status: 95% positive and PR status 90% positive, both with strong staining intensity; Proliferation marker Ki67 at 1%; Her2 status negative; Grade 1.  The patient has discussed antiestrogen treatment options with Dr. Chryl Heck, and is leaning towards tamoxifen since she is worried about bone density loss with aromatase inhibitors. Dr. Chryl Heck has provided further information about both tamoxifen and anastrozole to the patient to review, so that she may make an informed decision. The patient will returned to Dr. Chryl Heck following completion of XRT for further discussion of antiestrogen treatment options.     Symptomatically, the patient reports: ***        ALLERGIES:  is allergic to dust mite extract.  Meds: Current Outpatient Medications  Medication Sig Dispense Refill   alendronate (FOSAMAX) 70 MG tablet Take 70 mg by mouth once a  week.     aspirin EC 81 MG tablet Take 81 mg by mouth daily. Swallow whole.     Cholecalciferol (D3-1000) 25 MCG (1000 UT) capsule Take 1,000 Units by mouth at bedtime.     Cyanocobalamin (CVS B12 QUICK DISSOLVE PO) Take 5,000 mcg by mouth daily.     levothyroxine (SYNTHROID) 75 MCG tablet Take 75 mcg by mouth daily.     lovastatin (MEVACOR) 40 MG tablet Take 40 mg by mouth at bedtime.     Magnesium 500 MG CAPS Take by mouth.     Multiple Vitamins-Minerals (MULTIVITAMIN ADULTS 50+ PO) Take 1 tablet by mouth daily.     Omega-3 Fatty Acids (FISH OIL) 1000 MG CAPS Take 1 capsule by mouth daily.     oxyCODONE (OXY IR/ROXICODONE) 5 MG immediate release tablet Take 1 tablet (5 mg total) by mouth every 6 (six) hours as needed for severe pain. 15 tablet 0   TURMERIC CURCUMIN PO Take 1 tablet by mouth daily.     No current facility-administered medications for this encounter.    Physical Findings:  vitals were not taken for this visit. .     General: Alert and oriented, in no acute distress HEENT: Head is normocephalic. Extraocular movements are intact. Oropharynx is clear. Neck: Neck is supple, no palpable cervical or supraclavicular lymphadenopathy. Heart: Regular in rate and rhythm with no murmurs, rubs, or gallops. Chest: Clear to auscultation bilaterally, with no rhonchi, wheezes, or rales. Abdomen: Soft, nontender, nondistended, with no rigidity or guarding. Extremities: No cyanosis or edema. Lymphatics: see Neck Exam Musculoskeletal: symmetric  strength and muscle tone throughout. Neurologic: No obvious focalities. Speech is fluent.  Psychiatric: Judgment and insight are intact. Affect is appropriate. Breast exam reveals ***  Lab Findings: No results found for: "WBC", "HGB", "HCT", "MCV", "PLT"  @LASTCHEMISTRY @  Radiographic Findings: MM Breast Surgical Specimen  Result Date: 06/26/2022 CLINICAL DATA:  Patient is post radioactive seed localization subsequent surgical excision left  breast. EXAM: SPECIMEN RADIOGRAPH OF THE LEFT BREAST COMPARISON:  Previous exam(s). FINDINGS: Status post excision of the left breast. The radioactive seed and X shaped biopsy marker clip are present, completely intact, and were marked for pathology. Results called to the OR at the time of dictation. IMPRESSION: Specimen radiograph of the left breast. Electronically Signed   By: Marin Olp M.D.   On: 06/26/2022 15:49  MM LT RADIOACTIVE SEED LOC MAMMO GUIDE  Result Date: 06/25/2022 CLINICAL DATA:  Radioactive seed localization prior to surgery (X shaped clip). EXAM: MAMMOGRAPHIC GUIDED RADIOACTIVE SEED LOCALIZATION OF THE LEFT BREAST COMPARISON:  Previous exam(s). FINDINGS: Patient presents for radioactive seed localization prior to surgery. I met with the patient and we discussed the procedure of seed localization including benefits and alternatives. We discussed the high likelihood of a successful procedure. We discussed the risks of the procedure including infection, bleeding, tissue injury and further surgery. We discussed the low dose of radioactivity involved in the procedure. Informed, written consent was given. The usual time-out protocol was performed immediately prior to the procedure. Using mammographic guidance, sterile technique, 1% lidocaine and an I-125 radioactive seed, the X shaped clip was localized using a lateral to medial approach. The follow-up mammogram images confirm the seed in the expected location and were marked for Dr. Brantley Stage. Follow-up survey of the patient confirms presence of the radioactive seed. Order number of I-125 seed:  [id number]. Total activity:  2.130 millicuries reference Date: 04/09/2022 The patient tolerated the procedure well and was released from the [place]. She was given instructions regarding seed removal. IMPRESSION: Radioactive seed localization left breast. No apparent complications. Electronically Signed   By: Lillia Mountain M.D.   On: 06/25/2022 14:53    Impression/Plan: We discussed adjuvant radiotherapy today.  I recommend *** in order to ***.  I reviewed the logistics, benefits, risks, and potential side effects of this treatment in detail. Risks may include but not necessary be limited to acute and late injury tissue in the radiation fields such as skin irritation (change in color/pigmentation, itching, dryness, pain, peeling). She may experience fatigue. We also discussed possible risk of long term cosmetic changes or scar tissue. There is also a smaller risk for lung toxicity, ***cardiac toxicity, ***brachial plexopathy, ***lymphedema, ***musculoskeletal changes, ***rib fragility or ***induction of a second malignancy, ***late chronic non-healing soft tissue wound.    The patient asked good questions which I answered to her satisfaction. She is enthusiastic about proceeding with treatment. A consent form has been *** signed and placed in her chart.  A total of *** medically necessary complex treatment devices will be fabricated and supervised by me: *** fields with MLCs for custom blocks to protect heart, and lungs;  and, a Vac-lok. MORE COMPLEX DEVICES MAY BE MADE IN DOSIMETRY FOR FIELD IN FIELD BEAMS FOR DOSE HOMOGENEITY.  I have requested : 3D Simulation which is medically necessary to give adequate dose to at risk tissues while sparing lungs and heart.  I have requested a DVH of the following structures: lungs, heart, *** lumpectomy cavity.    The patient will receive *** Gy in ***  fractions to the *** with *** fields.  This will be *** followed by a boost.  On date of service, in total, I spent *** minutes on this encounter. Patient was seen in person.  _____________________________________   Eppie Gibson, MD  This document serves as a record of services personally performed by Eppie Gibson, MD. It was created on her behalf by Roney Mans, a trained medical scribe. The creation of this record is based on the scribe's personal  observations and the provider's statements to them. This document has been checked and approved by the attending provider.

## 2022-07-22 ENCOUNTER — Other Ambulatory Visit: Payer: Self-pay

## 2022-07-22 ENCOUNTER — Ambulatory Visit
Admission: RE | Admit: 2022-07-22 | Discharge: 2022-07-22 | Disposition: A | Discharge status: Home / Self Care | Payer: Medicare Other | Source: Ambulatory Visit | Attending: Radiation Oncology | Admitting: Radiation Oncology

## 2022-07-22 ENCOUNTER — Encounter: Payer: Self-pay | Admitting: Radiation Oncology

## 2022-07-22 VITALS — BP 135/60 | HR 61 | Temp 97.9°F | Resp 18 | Ht 62.0 in | Wt 131.0 lb

## 2022-07-22 DIAGNOSIS — Z510 Encounter for antineoplastic radiation therapy: Secondary | ICD-10-CM | POA: Insufficient documentation

## 2022-07-22 DIAGNOSIS — Z170 Estrogen receptor positive status [ER+]: Secondary | ICD-10-CM | POA: Insufficient documentation

## 2022-07-22 DIAGNOSIS — Z7982 Long term (current) use of aspirin: Secondary | ICD-10-CM | POA: Diagnosis not present

## 2022-07-22 DIAGNOSIS — C50412 Malignant neoplasm of upper-outer quadrant of left female breast: Secondary | ICD-10-CM | POA: Diagnosis present

## 2022-07-22 DIAGNOSIS — Z79890 Hormone replacement therapy (postmenopausal): Secondary | ICD-10-CM | POA: Diagnosis not present

## 2022-07-22 DIAGNOSIS — C50512 Malignant neoplasm of lower-outer quadrant of left female breast: Secondary | ICD-10-CM

## 2022-07-22 NOTE — Progress Notes (Signed)
Location of Breast Cancer:  Malignant neoplasm of upper-outer quadrant of left breast in female, estrogen receptor positive   Histology per Pathology Report:  06/26/2022 FINAL MICROSCOPIC DIAGNOSIS:  A.   BREAST, LEFT, LUMPECTOMY:  -    Invasive carcinoma of no special type (ductal), grade 1, 3 mm in greatest dimension, with microcalcifications.  -    Resection margin status: All margins negative for tumor.  -    Distance from invasive carcinoma to closest resection edge: 0.3 mm (posterior margin).  (Final posterior margin: 9 mm (based on thickness of additional posterior marginal tissue submitted as Specimen B).  -    Atypical duct hyperplasia, micropapillary pattern.  -    Prior biopsy site changes.  B.   BREAST, LEFT POSTERIOR MARGIN, EXCISION:  -    Negative for malignancy.  -    Skeletal muscle present.  Receptor Status: ER(95%), PR (90%), Her2-neu (Negative), Ki-67(1%)  Past/Anticipated interventions by surgeon, if any:  06/26/2022 --Dr. Marcello Moores [place] Left breast seed localized lumpectomy  Past/Anticipated interventions by medical oncology, if any:  Under care of Dr. Benay Pike 07/14/2022 We have reviewed role of adjuvant radiation and antiestrogen therapy.   She is currently leaning towards tamoxifen since she is worried about bone density loss with aromatase inhibitors.   I have however given her printed information about both tamoxifen and anastrozole for further review.   She should return to clinic after completion of adjuvant radiation.   She expressed understanding of the recommendations.  Lymphedema issues, if any:  Some swelling in her breast area    Pain issues, if any:  None   SAFETY ISSUES: Prior radiation? Had nodules on her thyroid and took propylthiouracil; she then had radioactive iodine treatment (believes this was done in the late 90s early 2000s) Developed hypothyroidism and for which she takes levothyroxine   Pacemaker/ICD? No Possible current  pregnancy? No--postmenopausal  Is the patient on methotrexate? No  Current Complaints / other details:   Vitals:   07/22/22 0927  BP: 135/60  Pulse: 61  Resp: 18  Temp: 97.9 F (36.6 C)  TempSrc: Oral  SpO2: 99%  Weight: 59.4 kg  Height: _0  (1.575 m)

## 2022-07-25 DIAGNOSIS — C50512 Malignant neoplasm of lower-outer quadrant of left female breast: Secondary | ICD-10-CM | POA: Diagnosis not present

## 2022-07-25 DIAGNOSIS — Z170 Estrogen receptor positive status [ER+]: Secondary | ICD-10-CM | POA: Diagnosis not present

## 2022-07-25 DIAGNOSIS — Z510 Encounter for antineoplastic radiation therapy: Secondary | ICD-10-CM | POA: Diagnosis not present

## 2022-07-28 ENCOUNTER — Encounter: Payer: Self-pay | Admitting: *Deleted

## 2022-07-29 ENCOUNTER — Other Ambulatory Visit: Payer: Self-pay

## 2022-07-29 ENCOUNTER — Ambulatory Visit
Admission: RE | Admit: 2022-07-29 | Discharge: 2022-07-29 | Disposition: A | Discharge status: Home / Self Care | Payer: Medicare Other | Source: Ambulatory Visit | Attending: Radiation Oncology | Admitting: Radiation Oncology

## 2022-07-29 DIAGNOSIS — C50512 Malignant neoplasm of lower-outer quadrant of left female breast: Secondary | ICD-10-CM | POA: Diagnosis not present

## 2022-07-29 LAB — RAD ONC ARIA SESSION SUMMARY
Course Elapsed Days: 0
Plan Fractions Treated to Date: 1
Plan Prescribed Dose Per Fraction: 2.67 Gy
Plan Total Fractions Prescribed: 15
Plan Total Prescribed Dose: 40.05 Gy
Reference Point Dosage Given to Date: 2.67 Gy
Reference Point Session Dosage Given: 2.67 Gy
Session Number: 1

## 2022-07-30 ENCOUNTER — Other Ambulatory Visit: Payer: Self-pay

## 2022-07-30 ENCOUNTER — Ambulatory Visit
Admission: RE | Admit: 2022-07-30 | Discharge: 2022-07-30 | Disposition: A | Discharge status: Home / Self Care | Payer: Medicare Other | Source: Ambulatory Visit | Attending: Radiation Oncology | Admitting: Radiation Oncology

## 2022-07-30 DIAGNOSIS — C50512 Malignant neoplasm of lower-outer quadrant of left female breast: Secondary | ICD-10-CM | POA: Diagnosis not present

## 2022-07-30 LAB — RAD ONC ARIA SESSION SUMMARY
Course Elapsed Days: 1
Plan Fractions Treated to Date: 2
Plan Prescribed Dose Per Fraction: 2.67 Gy
Plan Total Fractions Prescribed: 15
Plan Total Prescribed Dose: 40.05 Gy
Reference Point Dosage Given to Date: 5.34 Gy
Reference Point Session Dosage Given: 2.67 Gy
Session Number: 2

## 2022-07-31 ENCOUNTER — Ambulatory Visit
Admission: RE | Admit: 2022-07-31 | Discharge: 2022-07-31 | Disposition: A | Discharge status: Home / Self Care | Payer: Medicare Other | Source: Ambulatory Visit | Attending: Radiation Oncology | Admitting: Radiation Oncology

## 2022-07-31 ENCOUNTER — Other Ambulatory Visit: Payer: Self-pay

## 2022-07-31 DIAGNOSIS — C50512 Malignant neoplasm of lower-outer quadrant of left female breast: Secondary | ICD-10-CM | POA: Diagnosis not present

## 2022-07-31 LAB — RAD ONC ARIA SESSION SUMMARY
Course Elapsed Days: 2
Plan Fractions Treated to Date: 3
Plan Prescribed Dose Per Fraction: 2.67 Gy
Plan Total Fractions Prescribed: 15
Plan Total Prescribed Dose: 40.05 Gy
Reference Point Dosage Given to Date: 8.01 Gy
Reference Point Session Dosage Given: 2.67 Gy
Session Number: 3

## 2022-08-01 ENCOUNTER — Other Ambulatory Visit: Payer: Self-pay

## 2022-08-01 ENCOUNTER — Ambulatory Visit
Admission: RE | Admit: 2022-08-01 | Discharge: 2022-08-01 | Disposition: A | Discharge status: Home / Self Care | Payer: Medicare Other | Source: Ambulatory Visit | Attending: Radiation Oncology | Admitting: Radiation Oncology

## 2022-08-01 DIAGNOSIS — Z510 Encounter for antineoplastic radiation therapy: Secondary | ICD-10-CM | POA: Diagnosis not present

## 2022-08-01 DIAGNOSIS — C50512 Malignant neoplasm of lower-outer quadrant of left female breast: Secondary | ICD-10-CM | POA: Insufficient documentation

## 2022-08-01 DIAGNOSIS — Z170 Estrogen receptor positive status [ER+]: Secondary | ICD-10-CM | POA: Diagnosis not present

## 2022-08-01 LAB — RAD ONC ARIA SESSION SUMMARY
Course Elapsed Days: 3
Plan Fractions Treated to Date: 4
Plan Prescribed Dose Per Fraction: 2.67 Gy
Plan Total Fractions Prescribed: 15
Plan Total Prescribed Dose: 40.05 Gy
Reference Point Dosage Given to Date: 10.68 Gy
Reference Point Session Dosage Given: 2.67 Gy
Session Number: 4

## 2022-08-05 ENCOUNTER — Ambulatory Visit
Admission: RE | Admit: 2022-08-05 | Discharge: 2022-08-05 | Disposition: A | Discharge status: Home / Self Care | Payer: Medicare Other | Source: Ambulatory Visit | Attending: Radiation Oncology | Admitting: Radiation Oncology

## 2022-08-05 ENCOUNTER — Other Ambulatory Visit: Payer: Self-pay

## 2022-08-05 DIAGNOSIS — C50512 Malignant neoplasm of lower-outer quadrant of left female breast: Secondary | ICD-10-CM | POA: Diagnosis not present

## 2022-08-05 LAB — RAD ONC ARIA SESSION SUMMARY
Course Elapsed Days: 7
Plan Fractions Treated to Date: 5
Plan Prescribed Dose Per Fraction: 2.67 Gy
Plan Total Fractions Prescribed: 15
Plan Total Prescribed Dose: 40.05 Gy
Reference Point Dosage Given to Date: 13.35 Gy
Reference Point Session Dosage Given: 2.67 Gy
Session Number: 5

## 2022-08-05 MED ORDER — ALRA NON-METALLIC DEODORANT (RAD-ONC)
1.0000 | Freq: Once | TOPICAL | Status: AC
  Administered 2022-08-05: 1 via TOPICAL

## 2022-08-05 MED ORDER — RADIAPLEXRX EX GEL: Freq: Two times a day (BID) | CUTANEOUS | Status: DC

## 2022-08-05 NOTE — Progress Notes (Signed)
Pt here for patient teaching.  Pt given Radiation and You booklet, skin care instructions, Alra deodorant, and Radiaplex gel.  Reviewed areas of pertinence such as fatigue, skin changes, breast tenderness, and breast swelling . Pt able to give teach back of to pat skin and use unscented/gentle soap,apply Radiaplex bid, avoid applying anything to skin within 4 hours of treatment, avoid wearing an under wire bra, and to use an electric razor if they must shave. Pt verbalizes understanding of information given and will contact nursing with any questions or concerns.     [email or web address]

## 2022-08-06 ENCOUNTER — Other Ambulatory Visit: Payer: Self-pay

## 2022-08-06 ENCOUNTER — Ambulatory Visit
Admission: RE | Admit: 2022-08-06 | Discharge: 2022-08-06 | Disposition: A | Discharge status: Home / Self Care | Payer: Medicare Other | Source: Ambulatory Visit | Attending: Radiation Oncology | Admitting: Radiation Oncology

## 2022-08-06 DIAGNOSIS — C50512 Malignant neoplasm of lower-outer quadrant of left female breast: Secondary | ICD-10-CM | POA: Diagnosis not present

## 2022-08-06 LAB — RAD ONC ARIA SESSION SUMMARY
Course Elapsed Days: 8
Plan Fractions Treated to Date: 6
Plan Prescribed Dose Per Fraction: 2.67 Gy
Plan Total Fractions Prescribed: 15
Plan Total Prescribed Dose: 40.05 Gy
Reference Point Dosage Given to Date: 16.02 Gy
Reference Point Session Dosage Given: 2.67 Gy
Session Number: 6

## 2022-08-07 ENCOUNTER — Other Ambulatory Visit: Payer: Self-pay

## 2022-08-07 ENCOUNTER — Ambulatory Visit
Admission: RE | Admit: 2022-08-07 | Discharge: 2022-08-07 | Disposition: A | Discharge status: Home / Self Care | Payer: Medicare Other | Source: Ambulatory Visit | Attending: Radiation Oncology | Admitting: Radiation Oncology

## 2022-08-07 DIAGNOSIS — C50512 Malignant neoplasm of lower-outer quadrant of left female breast: Secondary | ICD-10-CM | POA: Diagnosis not present

## 2022-08-07 LAB — RAD ONC ARIA SESSION SUMMARY
Course Elapsed Days: 9
Plan Fractions Treated to Date: 7
Plan Prescribed Dose Per Fraction: 2.67 Gy
Plan Total Fractions Prescribed: 15
Plan Total Prescribed Dose: 40.05 Gy
Reference Point Dosage Given to Date: 18.69 Gy
Reference Point Session Dosage Given: 2.67 Gy
Session Number: 7

## 2022-08-08 ENCOUNTER — Other Ambulatory Visit: Payer: Self-pay

## 2022-08-08 ENCOUNTER — Ambulatory Visit
Admission: RE | Admit: 2022-08-08 | Discharge: 2022-08-08 | Disposition: A | Discharge status: Home / Self Care | Payer: Medicare Other | Source: Ambulatory Visit | Attending: Radiation Oncology | Admitting: Radiation Oncology

## 2022-08-08 DIAGNOSIS — C50512 Malignant neoplasm of lower-outer quadrant of left female breast: Secondary | ICD-10-CM | POA: Diagnosis not present

## 2022-08-08 LAB — RAD ONC ARIA SESSION SUMMARY
Course Elapsed Days: 10
Plan Fractions Treated to Date: 8
Plan Prescribed Dose Per Fraction: 2.67 Gy
Plan Total Fractions Prescribed: 15
Plan Total Prescribed Dose: 40.05 Gy
Reference Point Dosage Given to Date: 21.36 Gy
Reference Point Session Dosage Given: 2.67 Gy
Session Number: 8

## 2022-08-11 ENCOUNTER — Ambulatory Visit
Admission: RE | Admit: 2022-08-11 | Discharge: 2022-08-11 | Disposition: A | Discharge status: Home / Self Care | Payer: Medicare Other | Source: Ambulatory Visit | Attending: Radiation Oncology | Admitting: Radiation Oncology

## 2022-08-11 ENCOUNTER — Other Ambulatory Visit: Payer: Self-pay

## 2022-08-11 ENCOUNTER — Ambulatory Visit: Payer: Medicare Other

## 2022-08-11 DIAGNOSIS — C50512 Malignant neoplasm of lower-outer quadrant of left female breast: Secondary | ICD-10-CM | POA: Diagnosis not present

## 2022-08-11 LAB — RAD ONC ARIA SESSION SUMMARY
Course Elapsed Days: 13
Plan Fractions Treated to Date: 9
Plan Prescribed Dose Per Fraction: 2.67 Gy
Plan Total Fractions Prescribed: 15
Plan Total Prescribed Dose: 40.05 Gy
Reference Point Dosage Given to Date: 24.03 Gy
Reference Point Session Dosage Given: 2.67 Gy
Session Number: 9

## 2022-08-12 ENCOUNTER — Ambulatory Visit
Admission: RE | Admit: 2022-08-12 | Discharge: 2022-08-12 | Disposition: A | Discharge status: Home / Self Care | Payer: Medicare Other | Source: Ambulatory Visit | Attending: Radiation Oncology | Admitting: Radiation Oncology

## 2022-08-12 ENCOUNTER — Other Ambulatory Visit: Payer: Self-pay

## 2022-08-12 DIAGNOSIS — C50512 Malignant neoplasm of lower-outer quadrant of left female breast: Secondary | ICD-10-CM | POA: Diagnosis not present

## 2022-08-12 LAB — RAD ONC ARIA SESSION SUMMARY
Course Elapsed Days: 14
Plan Fractions Treated to Date: 10
Plan Prescribed Dose Per Fraction: 2.67 Gy
Plan Total Fractions Prescribed: 15
Plan Total Prescribed Dose: 40.05 Gy
Reference Point Dosage Given to Date: 26.7 Gy
Reference Point Session Dosage Given: 2.67 Gy
Session Number: 10

## 2022-08-13 ENCOUNTER — Ambulatory Visit
Admission: RE | Admit: 2022-08-13 | Discharge: 2022-08-13 | Disposition: A | Discharge status: Home / Self Care | Payer: Medicare Other | Source: Ambulatory Visit | Attending: Radiation Oncology | Admitting: Radiation Oncology

## 2022-08-13 ENCOUNTER — Other Ambulatory Visit: Payer: Self-pay

## 2022-08-13 DIAGNOSIS — C50512 Malignant neoplasm of lower-outer quadrant of left female breast: Secondary | ICD-10-CM | POA: Diagnosis not present

## 2022-08-13 LAB — RAD ONC ARIA SESSION SUMMARY
Course Elapsed Days: 15
Plan Fractions Treated to Date: 11
Plan Prescribed Dose Per Fraction: 2.67 Gy
Plan Total Fractions Prescribed: 15
Plan Total Prescribed Dose: 40.05 Gy
Reference Point Dosage Given to Date: 29.37 Gy
Reference Point Session Dosage Given: 2.67 Gy
Session Number: 11

## 2022-08-14 ENCOUNTER — Other Ambulatory Visit: Payer: Self-pay

## 2022-08-14 ENCOUNTER — Ambulatory Visit
Admission: RE | Admit: 2022-08-14 | Discharge: 2022-08-14 | Disposition: A | Discharge status: Home / Self Care | Payer: Medicare Other | Source: Ambulatory Visit | Attending: Radiation Oncology | Admitting: Radiation Oncology

## 2022-08-14 DIAGNOSIS — C50512 Malignant neoplasm of lower-outer quadrant of left female breast: Secondary | ICD-10-CM | POA: Diagnosis not present

## 2022-08-14 LAB — RAD ONC ARIA SESSION SUMMARY
Course Elapsed Days: 16
Plan Fractions Treated to Date: 12
Plan Prescribed Dose Per Fraction: 2.67 Gy
Plan Total Fractions Prescribed: 15
Plan Total Prescribed Dose: 40.05 Gy
Reference Point Dosage Given to Date: 32.04 Gy
Reference Point Session Dosage Given: 2.67 Gy
Session Number: 12

## 2022-08-15 ENCOUNTER — Ambulatory Visit
Admission: RE | Admit: 2022-08-15 | Discharge: 2022-08-15 | Disposition: A | Discharge status: Home / Self Care | Payer: Medicare Other | Source: Ambulatory Visit | Attending: Radiation Oncology | Admitting: Radiation Oncology

## 2022-08-15 ENCOUNTER — Other Ambulatory Visit: Payer: Self-pay

## 2022-08-15 DIAGNOSIS — C50512 Malignant neoplasm of lower-outer quadrant of left female breast: Secondary | ICD-10-CM | POA: Diagnosis not present

## 2022-08-15 LAB — RAD ONC ARIA SESSION SUMMARY
Course Elapsed Days: 17
Plan Fractions Treated to Date: 13
Plan Prescribed Dose Per Fraction: 2.67 Gy
Plan Total Fractions Prescribed: 15
Plan Total Prescribed Dose: 40.05 Gy
Reference Point Dosage Given to Date: 34.71 Gy
Reference Point Session Dosage Given: 2.67 Gy
Session Number: 13

## 2022-08-18 ENCOUNTER — Ambulatory Visit: Admission: RE | Admit: 2022-08-18 | Payer: Medicare Other | Source: Ambulatory Visit

## 2022-08-18 ENCOUNTER — Ambulatory Visit
Admission: RE | Admit: 2022-08-18 | Discharge: 2022-08-18 | Disposition: A | Discharge status: Home / Self Care | Payer: Medicare Other | Source: Ambulatory Visit | Attending: Radiation Oncology | Admitting: Radiation Oncology

## 2022-08-18 ENCOUNTER — Encounter: Payer: Self-pay | Admitting: *Deleted

## 2022-08-18 ENCOUNTER — Other Ambulatory Visit: Payer: Self-pay

## 2022-08-18 DIAGNOSIS — C50512 Malignant neoplasm of lower-outer quadrant of left female breast: Secondary | ICD-10-CM | POA: Diagnosis not present

## 2022-08-18 LAB — RAD ONC ARIA SESSION SUMMARY
Course Elapsed Days: 20
Plan Fractions Treated to Date: 14
Plan Prescribed Dose Per Fraction: 2.67 Gy
Plan Total Fractions Prescribed: 15
Plan Total Prescribed Dose: 40.05 Gy
Reference Point Dosage Given to Date: 37.38 Gy
Reference Point Session Dosage Given: 2.67 Gy
Session Number: 14

## 2022-08-19 ENCOUNTER — Ambulatory Visit
Admission: RE | Admit: 2022-08-19 | Discharge: 2022-08-19 | Disposition: A | Discharge status: Home / Self Care | Payer: Medicare Other | Source: Ambulatory Visit | Attending: Radiation Oncology | Admitting: Radiation Oncology

## 2022-08-19 ENCOUNTER — Encounter: Payer: Self-pay | Admitting: Radiation Oncology

## 2022-08-19 ENCOUNTER — Other Ambulatory Visit: Payer: Self-pay

## 2022-08-19 DIAGNOSIS — C50512 Malignant neoplasm of lower-outer quadrant of left female breast: Secondary | ICD-10-CM | POA: Diagnosis not present

## 2022-08-19 LAB — RAD ONC ARIA SESSION SUMMARY
Course Elapsed Days: 21
Plan Fractions Treated to Date: 15
Plan Prescribed Dose Per Fraction: 2.67 Gy
Plan Total Fractions Prescribed: 15
Plan Total Prescribed Dose: 40.05 Gy
Reference Point Dosage Given to Date: 40.05 Gy
Reference Point Session Dosage Given: 2.67 Gy
Session Number: 15

## 2022-09-01 NOTE — Progress Notes (Signed)
                                                                                                                                                             Patient Name: Kristin Molina MRN: [id number] DOB: 1946-07-26 Referring Physician: Nils Flack (Profile Not Attached) Date of Service: 08/19/2022 [facility] [place]                                                        End Of Treatment Note  Diagnoses: C50.412-Malignant neoplasm of upper-outer quadrant of left female breast C50.512-Malignant neoplasm of lower-outer quadrant of left female breast  Cancer Staging:  Cancer Staging  Malignant neoplasm of lower-outer quadrant of left breast, estrogen receptor positive ([place]) Staging form: Breast, AJCC 8th Edition - Pathologic stage from 07/22/2022: Stage IA (pT1a, pN0, cM0, G1, ER+, PR+, HER2-) - Signed by Eppie Gibson, MD on 07/22/2022 Stage prefix: Initial diagnosis Method of lymph node assessment: Clinical Histologic grading system: 3 grade system   Intent: Curative  Radiation Treatment Dates: 07/29/2022 through 08/19/2022 Site Technique Total Dose (Gy) Dose per Fx (Gy) Completed Fx Beam Energies  Breast, Left: Breast_L 3D 40.05/40.05 2.67 15/15 6XFFF   Narrative: The patient tolerated radiation therapy relatively well.   Plan: The patient will follow-up with radiation oncology in 1 mo . -----------------------------------  Eppie Gibson, MD

## 2022-09-03 ENCOUNTER — Encounter: Payer: Self-pay | Admitting: Hematology and Oncology

## 2022-09-03 ENCOUNTER — Inpatient Hospital Stay: Payer: Medicare Other | Attending: Hematology and Oncology | Admitting: Hematology and Oncology

## 2022-09-03 VITALS — BP 127/50 | HR 65 | Temp 97.7°F | Resp 18 | Ht 62.0 in | Wt 132.2 lb

## 2022-09-03 DIAGNOSIS — Z803 Family history of malignant neoplasm of breast: Secondary | ICD-10-CM | POA: Diagnosis not present

## 2022-09-03 DIAGNOSIS — M810 Age-related osteoporosis without current pathological fracture: Secondary | ICD-10-CM | POA: Insufficient documentation

## 2022-09-03 DIAGNOSIS — E039 Hypothyroidism, unspecified: Secondary | ICD-10-CM | POA: Diagnosis not present

## 2022-09-03 DIAGNOSIS — Z79811 Long term (current) use of aromatase inhibitors: Secondary | ICD-10-CM | POA: Diagnosis not present

## 2022-09-03 DIAGNOSIS — Z79890 Hormone replacement therapy (postmenopausal): Secondary | ICD-10-CM | POA: Diagnosis not present

## 2022-09-03 DIAGNOSIS — C50512 Malignant neoplasm of lower-outer quadrant of left female breast: Secondary | ICD-10-CM | POA: Diagnosis not present

## 2022-09-03 DIAGNOSIS — Z86010 Personal history of colonic polyps: Secondary | ICD-10-CM | POA: Diagnosis not present

## 2022-09-03 DIAGNOSIS — Z923 Personal history of irradiation: Secondary | ICD-10-CM | POA: Insufficient documentation

## 2022-09-03 DIAGNOSIS — Z170 Estrogen receptor positive status [ER+]: Secondary | ICD-10-CM | POA: Insufficient documentation

## 2022-09-03 MED ORDER — ANASTROZOLE 1 MG PO TABS: 1.0000 mg | ORAL_TABLET | Freq: Every day | ORAL | 3 refills | Status: DC

## 2022-09-03 NOTE — Progress Notes (Signed)
[facility] CONSULT NOTE  Patient Care Team: Nicholes Rough, PA-C as PCP - General (Physician Assistant) Rockwell Germany, RN as Oncology Nurse Navigator Mauro Kaufmann, RN as Oncology Nurse Navigator Benay Pike, MD as Consulting Physician (Hematology and Oncology) Eppie Gibson, MD as Attending Physician (Radiation Oncology) Erroll Luna, MD as Consulting Physician (General Surgery)  CHIEF COMPLAINTS/PURPOSE OF CONSULTATION:  Newly diagnosed breast cancer  HISTORY OF PRESENTING ILLNESS:  Kristin Molina 76 y.o. female is here because of recent diagnosis of left breast cancer.  I reviewed her records extensively and collaborated the history with the patient.  SUMMARY OF ONCOLOGIC HISTORY: Oncology History  Malignant neoplasm of lower-outer quadrant of left breast, estrogen receptor positive ([place])  04/14/2022 Mammogram   Screening mammogram with possible mass in the left breast, diagnostic mammogram and ultrasound recommended.  Diagnostic mammogram showed indeterminate findings suspicious mass without sonographic correlate in the central left breast.   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, prognostic showed ER 95% positive strong staining PR 90% positive strong staining, Ki-67 of 1% and HER2 negative   06/06/2022 Initial Diagnosis   Malignant neoplasm of lower-outer quadrant of left breast, estrogen receptor positive ([place])   06/26/2022 Surgery   BREAST, LEFT, LUMPECTOMY:    06/26/2022 Definitive Surgery   Final pathology from left breast lumpectomy showed invasive carcinoma grade 1 ductal subtype, 3 mm in greatest dimension with microcalcifications, all margins negative for tumor, distance from invasive carcinoma to closest resection edge is 0.3 mm final posterior margin is 9 mm, atypical ductal hyperplasia, micropapillary pattern.   07/22/2022 Cancer Staging   Staging  form: Breast, AJCC 8th Edition - Pathologic stage from 07/22/2022: Stage IA (pT1a, pN0, cM0, G1, ER+, PR+, HER2-) - Signed by Eppie Gibson, MD on 07/22/2022 Stage prefix: Initial diagnosis Method of lymph node assessment: Clinical Histologic grading system: 3 grade system    Interval history  Ms. Kellianne is here for follow-up after recent lumpectomy.   She is now status post adjuvant radiation.  Since last visit, she denies any new complaints. She is ready to be done with all the treatment.  After her last visit here, she did some more research about tamoxifen and anastrozole and she decided to try anastrozole.  She has baseline osteopenia with a T score of -2.3 and is currently on Fosamax, this is managed by her PCP.  She also takes calcium and vitamin D supplementation. Rest of the pertinent 10 point ROS reviewed and negative  MEDICAL HISTORY:  Past Medical History:  Diagnosis Date   Arthritis    hips, hands   Cancer ([place]) 05/2022   left breast IDC   Colon polyps    Hypothyroidism    Osteoporosis    PONV (postoperative nausea and vomiting)     SURGICAL HISTORY: Past Surgical History:  Procedure Laterality Date   BREAST LUMPECTOMY WITH RADIOACTIVE SEED LOCALIZATION Left 06/26/2022   Procedure: LEFT BREAST LUMPECTOMY WITH RADIOACTIVE SEED LOCALIZATION;  Surgeon: Erroll Luna, MD;  Location: [facility];  Service: General;  Laterality: Left;   CERVICAL LAMINECTOMY     TUBAL LIGATION      SOCIAL HISTORY: Social History   Socioeconomic History   Marital status: Married    Spouse name: Not on file   Number of children: Not on file   Years of education: Not on file   Highest education level: Not on file  Occupational  History   Not on file  Tobacco Use   Smoking status: Former    Types: Cigarettes    Quit date: 1980    Years since quitting: 43.7   Smokeless tobacco: Never  Vaping Use   Vaping Use: Never used  Substance and Sexual Activity   Alcohol use:  Yes    Comment: 1 drink daily   Drug use: Never   Sexual activity: Yes    Birth control/protection: Post-menopausal  Other Topics Concern   Not on file  Social History Narrative   Not on file   Social Determinants of Health   Financial Resource Strain: Low Risk  (06/13/2022)   Overall Financial Resource Strain (CARDIA)    Difficulty of Paying Living Expenses: Not hard at all  Food Insecurity: No Food Insecurity (06/13/2022)   Hunger Vital Sign    Worried About Running Out of Food in the Last Year: Never true    Ran Out of Food in the Last Year: Never true  Transportation Needs: No Transportation Needs (06/13/2022)   PRAPARE - Hydrologist (Medical): No    Lack of Transportation (Non-Medical): No  Physical Activity: Not on file  Stress: Not on file  Social Connections: Not on file  Intimate Partner Violence: Not on file    FAMILY HISTORY: Family History  Problem Relation Age of Onset   Breast cancer Mother    Diabetes Mother    Colon cancer Neg Hx    Esophageal cancer Neg Hx    Pancreatic cancer Neg Hx    Stomach cancer Neg Hx    Liver disease Neg Hx     ALLERGIES:  is allergic to dust mite extract.  MEDICATIONS:  Current Outpatient Medications  Medication Sig Dispense Refill   anastrozole (ARIMIDEX) 1 MG tablet Take 1 tablet (1 mg total) by mouth daily. 90 tablet 3   alendronate (FOSAMAX) 70 MG tablet Take 70 mg by mouth once a week.     aspirin EC 81 MG tablet Take 81 mg by mouth daily. Swallow whole.     Cholecalciferol (D3-1000) 25 MCG (1000 UT) capsule Take 1,000 Units by mouth at bedtime.     Cyanocobalamin (CVS B12 QUICK DISSOLVE PO) Take 5,000 mcg by mouth daily.     levothyroxine (SYNTHROID) 75 MCG tablet Take 75 mcg by mouth daily.     lovastatin (MEVACOR) 40 MG tablet Take 40 mg by mouth at bedtime.     Magnesium 500 MG CAPS Take by mouth.     Multiple Vitamins-Minerals (MULTIVITAMIN ADULTS 50+ PO) Take 1 tablet by mouth daily.      Omega-3 Fatty Acids (FISH OIL) 1000 MG CAPS Take 1 capsule by mouth daily.     TURMERIC CURCUMIN PO Take 1 tablet by mouth daily.     No current facility-administered medications for this visit.    REVIEW OF SYSTEMS:   Constitutional: Denies fevers, chills or abnormal night sweats Eyes: Denies blurriness of vision, double vision or watery eyes Ears, nose, mouth, throat, and face: Denies mucositis or sore throat Respiratory: Denies cough, dyspnea or wheezes Cardiovascular: Denies palpitation, chest discomfort or lower extremity swelling Gastrointestinal:  Denies nausea, heartburn or change in bowel habits Skin: Denies abnormal skin rashes Lymphatics: Denies new lymphadenopathy or easy bruising Neurological:Denies numbness, tingling or new weaknesses Behavioral/Psych: Mood is stable, no new changes  Breast: Denies any palpable lumps or discharge All other systems were reviewed with the patient and are negative.  PHYSICAL EXAMINATION:  ECOG PERFORMANCE STATUS: 0 - Asymptomatic  Vitals:   09/03/22 1059  BP: (!) 127/50  Pulse: 65  Resp: 18  Temp: 97.7 F (36.5 C)  SpO2: 96%    Filed Weights   09/03/22 1059  Weight: 132 lb 3.2 oz (60 kg)    Physical exam deferred in lieu of counseling  LABORATORY DATA:  I have reviewed the data as listed No results found for: "WBC", "HGB", "HCT", "MCV", "PLT" No results found for: "NA", "K", "CL", "CO2"  RADIOGRAPHIC STUDIES: I have personally reviewed the radiological reports and agreed with the findings in the report.  ASSESSMENT AND PLAN:  Malignant neoplasm of lower-outer quadrant of left breast, estrogen receptor positive ([place]) This is a very pleasant 76 year old postmenopausal female patient with left breast lower outer invasive ductal carcinoma, grade 1, ER/PR strongly positive, Ki-67 of 1%, HER2 negative along with intermediate grade DCIS referred to medical oncology now status post lumpectomy who is here to review final  pathology and to discuss additional recommendations.  We have reviewed that this is a low-grade invasive ductal carcinoma measuring 3 mm, negative margins.  No sentinel lymph node submitted. We have reviewed role of adjuvant radiation and antiestrogen therapy.  She is now status post adjuvant radiation.  After her last visit here, she had a discussion with her PCP as well and they have also recommended aromatase inhibitors.  She is also on Fosamax for osteopenia T score of -2.3 done in May 2023, calcium and vitamin D supplementation for the same.  I think it is a reasonable recommendation to consider anastrozole.  I have Sent a prescription to the pharmacy of her choice.  She will call us with any new adverse effects on anastrozole.  We have once again discussed about common adverse effects with anastrozole. She is not interested in returning to the clinic for survivorship at this time.  She would like to alternate appointments between [place] and surgery team.  In that case since she is going to see the surgical team in January, I have recommended that she call us with any side effects, otherwise, in person in July 2024.  Total time spent : 30 minutes in review of records, H and P, counseling and coordination of care. All questions were answered. The patient knows to call the clinic with any problems, questions or concerns.    Benay Pike, MD 09/03/22

## 2022-09-03 NOTE — Assessment & Plan Note (Addendum)
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  We have reviewed that this is a low-grade invasive ductal carcinoma measuring 3 mm, negative margins.  No sentinel lymph node submitted. We have reviewed role of adjuvant radiation and antiestrogen therapy.  She is now status post adjuvant radiation.  After her last visit here, she had a discussion with her PCP as well and they have also recommended aromatase inhibitors.  She is also on Fosamax for osteopenia T score of -2.3 done in May 2023, calcium and vitamin D supplementation for the same.  I think it is a reasonable recommendation to consider anastrozole.  I have Sent a prescription to the pharmacy of her choice.  She will call us with any new adverse effects on anastrozole.  We have once again discussed about common adverse effects with anastrozole. She is not interested in returning to the clinic for survivorship at this time.  She would like to alternate appointments between [place] and surgery team.  In that case since she is going to see the surgical team in January, I have recommended that she call us with any side effects, otherwise, in person in July 2024.

## 2022-09-08 ENCOUNTER — Ambulatory Visit
Admission: RE | Admit: 2022-09-08 | Discharge: 2022-09-08 | Disposition: A | Discharge status: Home / Self Care | Payer: Medicare Other | Source: Ambulatory Visit | Attending: Hematology and Oncology | Admitting: Hematology and Oncology

## 2022-09-08 DIAGNOSIS — Z170 Estrogen receptor positive status [ER+]: Secondary | ICD-10-CM

## 2022-09-19 ENCOUNTER — Telehealth: Payer: Self-pay

## 2022-09-19 NOTE — Telephone Encounter (Signed)
Received call from patient stating that she was now a month out from completing radiation and was doing very well, so she would like to cancel her F/U with Dr. Isidore Moos scheduled for 10/01/22. Patient reports she's doing well and denies any lingering fatigue or pain to her left breast. She denies any tightness to her chest/shoulder or limitation in her range of motion to her left arm. She states she never experienced any blistering or peeling to her skin in the treatment field, and confirms that skin is intact and almost completely back to her baseline color/texture. She reports she has already started applying vitamin E oil to the treatment field, and plans to continue to do so for the next 2 months. She saw her medical oncologist Dr. Chryl Heck earlier this month and is scheduled to see her surgeon in January 2024, and Dr. Chryl Heck again July 2024. She also reports she's already scheduled for a mammogram in May 2024. She confirms she has started her anastrozole medication and reports she is tolerating it well thus far.   I informed her I will cancel her November appointment with Dr. Isidore Moos, and that moving forward she will follow-up as needed with our department. She verbalized understanding and agreement, and expressed her appreciation of the care she received from Dr. Isidore Moos and her team. No other needs identified at this time, but patient has our number should she have any questions or concerns for Dr. Isidore Moos in the future.

## 2022-09-26 ENCOUNTER — Ambulatory Visit: Payer: Self-pay | Admitting: Radiation Oncology

## 2022-10-01 ENCOUNTER — Ambulatory Visit: Payer: Medicare Other | Admitting: Radiation Oncology

## 2022-10-07 ENCOUNTER — Ambulatory Visit: Payer: Medicare Other | Admitting: Hematology and Oncology

## 2022-11-05 ENCOUNTER — Inpatient Hospital Stay: Payer: Medicare Other | Attending: Hematology and Oncology | Admitting: Hematology and Oncology

## 2022-11-05 ENCOUNTER — Encounter: Payer: Self-pay | Admitting: Hematology and Oncology

## 2022-11-05 DIAGNOSIS — Z170 Estrogen receptor positive status [ER+]: Secondary | ICD-10-CM | POA: Diagnosis not present

## 2022-11-05 DIAGNOSIS — C50512 Malignant neoplasm of lower-outer quadrant of left female breast: Secondary | ICD-10-CM

## 2022-11-05 NOTE — Progress Notes (Signed)
[facility] CONSULT NOTE  Patient Care Team: Nicholes Rough, PA-C as PCP - General (Physician Assistant) Rockwell Germany, RN as Oncology Nurse Navigator Mauro Kaufmann, RN as Oncology Nurse Navigator Benay Pike, MD as Consulting Physician (Hematology and Oncology) Eppie Gibson, MD as Attending Physician (Radiation Oncology) Erroll Luna, MD as Consulting Physician (General Surgery)  CHIEF COMPLAINTS/PURPOSE OF CONSULTATION:  Newly diagnosed breast cancer  HISTORY OF PRESENTING ILLNESS:  Kristin Molina 76 y.o. female is here because of recent diagnosis of left breast cancer.  I reviewed her records extensively and collaborated the history with the patient.  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  Diagnostic mammogram showed indeterminate findings suspicious mass without sonographic correlate in the central left breast.   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   07/22/2022 Cancer Staging   Staging  form: Breast, AJCC 8th Edition - Pathologic stage from 07/22/2022: Stage IA (pT1a, pN0, cM0, G1, ER+, PR+, HER2-) - Signed by Eppie Gibson, MD on 07/22/2022 Stage prefix: Initial diagnosis Method of lymph node assessment: Clinical Histologic grading system: 3 grade system    Interval history  Ms. Melany is here for follow-up after recent lumpectomy.   She is now status post adjuvant radiation.  She is here for telephone follow up with anastrozole. She has bad diarrhea even waking her up in the middle of night. No change in medications. She wondered if this is anastrozole causing it. She feels some anxiety here and there. Rest of the pertinent 10 point ROS reviewed and negative  MEDICAL HISTORY:  Past Medical History:  Diagnosis Date   Arthritis    hips, hands   Cancer ([place]) 05/2022   left breast IDC   Colon polyps    Hypothyroidism    Osteoporosis    PONV (postoperative nausea and vomiting)     SURGICAL HISTORY: Past Surgical History:  Procedure Laterality Date   BREAST LUMPECTOMY WITH RADIOACTIVE SEED LOCALIZATION Left 06/26/2022   Procedure: LEFT BREAST LUMPECTOMY WITH RADIOACTIVE SEED LOCALIZATION;  Surgeon: Erroll Luna, MD;  Location: [facility];  Service: General;  Laterality: Left;   CERVICAL LAMINECTOMY     TUBAL LIGATION      SOCIAL HISTORY: Social History   Socioeconomic History   Marital status: Married    Spouse name: Not on file   Number of children: Not on file   Years of education: Not on file   Highest education level: Not on file  Occupational History   Not on file  Tobacco Use   Smoking status: Former    Types: Cigarettes    Quit date: 1980    Years  since quitting: 43.9   Smokeless tobacco: Never  Vaping Use   Vaping Use: Never used  Substance and Sexual Activity   Alcohol use: Yes    Comment: 1 drink daily   Drug use: Never   Sexual activity: Yes    Birth control/protection: Post-menopausal  Other Topics Concern   Not  on file  Social History Narrative   Not on file   Social Determinants of Health   Financial Resource Strain: Low Risk  (06/13/2022)   Overall Financial Resource Strain (CARDIA)    Difficulty of Paying Living Expenses: Not hard at all  Food Insecurity: No Food Insecurity (06/13/2022)   Hunger Vital Sign    Worried About Running Out of Food in the Last Year: Never true    Ran Out of Food in the Last Year: Never true  Transportation Needs: No Transportation Needs (06/13/2022)   PRAPARE - Hydrologist (Medical): No    Lack of Transportation (Non-Medical): No  Physical Activity: Not on file  Stress: Not on file  Social Connections: Not on file  Intimate Partner Violence: Not on file    FAMILY HISTORY: Family History  Problem Relation Age of Onset   Breast cancer Mother    Diabetes Mother    Colon cancer Neg Hx    Esophageal cancer Neg Hx    Pancreatic cancer Neg Hx    Stomach cancer Neg Hx    Liver disease Neg Hx     ALLERGIES:  is allergic to dust mite extract.  MEDICATIONS:  Current Outpatient Medications  Medication Sig Dispense Refill   alendronate (FOSAMAX) 70 MG tablet Take 70 mg by mouth once a week.     anastrozole (ARIMIDEX) 1 MG tablet Take 1 tablet (1 mg total) by mouth daily. 90 tablet 3   aspirin EC 81 MG tablet Take 81 mg by mouth daily. Swallow whole.     Cholecalciferol (D3-1000) 25 MCG (1000 UT) capsule Take 1,000 Units by mouth at bedtime.     Cyanocobalamin ([employer]S B12 QUICK DISSOLVE PO) Take 5,000 mcg by mouth daily.     levothyroxine (SYNTHROID) 75 MCG tablet Take 75 mcg by mouth daily.     lovastatin (MEVACOR) 40 MG tablet Take 40 mg by mouth at bedtime.     Magnesium 500 MG CAPS Take by mouth.     Multiple Vitamins-Minerals (MULTIVITAMIN ADULTS 50+ PO) Take 1 tablet by mouth daily.     Omega-3 Fatty Acids (FISH OIL) 1000 MG CAPS Take 1 capsule by mouth daily.     TURMERIC CURCUMIN PO Take 1 tablet by mouth daily.     No  current facility-administered medications for this visit.    REVIEW OF SYSTEMS:   Constitutional: Denies fevers, chills or abnormal night sweats Eyes: Denies blurriness of vision, double vision or watery eyes Ears, nose, mouth, throat, and face: Denies mucositis or sore throat Respiratory: Denies cough, dyspnea or wheezes Cardiovascular: Denies palpitation, chest discomfort or lower extremity swelling Gastrointestinal:  Denies nausea, heartburn or change in bowel habits Skin: Denies abnormal skin rashes Lymphatics: Denies new lymphadenopathy or easy bruising Neurological:Denies numbness, tingling or new weaknesses Behavioral/Psych: Mood is stable, no new changes  Breast: Denies any palpable lumps or discharge All other systems were reviewed with the patient and are negative.  PHYSICAL EXAMINATION: ECOG PERFORMANCE STATUS: 0 - Asymptomatic  There were no vitals filed for this visit.   There were no vitals filed for this visit.   Physical exam  deferred in lieu of telephone visit.  LABORATORY DATA:  I have reviewed the data as listed No results found for: "WBC", "HGB", "HCT", "MCV", "PLT" No results found for: "NA", "K", "CL", "CO2"  RADIOGRAPHIC STUDIES: I have personally reviewed the radiological reports and agreed with the findings in the report.  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  We have reviewed that this is a low-grade invasive ductal carcinoma measuring 3 mm, negative margins.  No sentinel lymph node submitted. We have reviewed role of adjuvant  radiation and antiestrogen therapy. She is now here for telephone visit since she has been experiencing diarrhea and wondered if this could be the anastrozole. We have discussed about holding the anastrozole for 2 weeks although diarrhea is a very uncommon side effect from this medication.  We will get in touch with her again in 2 to 3 weeks to see how she is doing and discuss additional recommendations.  If diarrhea indeed resolves after stopping anastrozole, we can try switching her to letrozole or exemestane.  She is okay with this plan.  I connected with  Freada Twersky Fede on 11/05/22 by a telephone application and verified that I am speaking with the correct person using two identifiers.   I discussed the limitations of evaluation and management by telemedicine. The patient expressed understanding and agreed to proceed.  Total time spent : 11 minutes in review of records, H and P, counseling and coordination of care. All questions were answered. The patient knows to call the clinic with any problems, questions or concerns.    Benay Pike, MD 11/05/22

## 2022-11-05 NOTE — Assessment & Plan Note (Addendum)
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  We have reviewed that this is a low-grade invasive ductal carcinoma measuring 3 mm, negative margins.  No sentinel lymph node submitted. We have reviewed role of adjuvant radiation and antiestrogen therapy. She is now here for telephone visit since she has been experiencing diarrhea and wondered if this could be the anastrozole. We have discussed about holding the anastrozole for 2 weeks although diarrhea is a very uncommon side effect from this medication.  We will get in touch with her again in 2 to 3 weeks to see how she is doing and discuss additional recommendations.  If diarrhea indeed resolves after stopping anastrozole, we can try switching her to letrozole or exemestane.  She is okay with this plan.

## 2022-11-20 ENCOUNTER — Other Ambulatory Visit: Payer: Self-pay | Admitting: *Deleted

## 2022-11-20 ENCOUNTER — Inpatient Hospital Stay: Payer: Medicare Other | Admitting: Hematology and Oncology

## 2022-11-20 ENCOUNTER — Telehealth: Payer: Self-pay | Admitting: *Deleted

## 2022-11-20 DIAGNOSIS — C50512 Malignant neoplasm of lower-outer quadrant of left female breast: Secondary | ICD-10-CM

## 2022-11-20 DIAGNOSIS — Z170 Estrogen receptor positive status [ER+]: Secondary | ICD-10-CM

## 2022-11-20 MED ORDER — EXEMESTANE 25 MG PO TABS: 25.0000 mg | ORAL_TABLET | Freq: Every day | ORAL | 1 refills | Status: DC

## 2022-11-20 NOTE — Progress Notes (Signed)
[facility] CONSULT NOTE  Patient Care Team: Nicholes Rough, PA-C as PCP - General (Physician Assistant) Rockwell Germany, RN as Oncology Nurse Navigator Mauro Kaufmann, RN as Oncology Nurse Navigator Benay Pike, MD as Consulting Physician (Hematology and Oncology) Eppie Gibson, MD as Attending Physician (Radiation Oncology) Erroll Luna, MD as Consulting Physician (General Surgery)  CHIEF COMPLAINTS/PURPOSE OF CONSULTATION:  Newly diagnosed breast cancer  HISTORY OF PRESENTING ILLNESS:  Kristin Molina 76 y.o. female is here because of recent diagnosis of left breast cancer.  I reviewed her records extensively and collaborated the history with the patient.  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  Diagnostic mammogram showed indeterminate findings suspicious mass without sonographic correlate in the central left breast.   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   07/22/2022 Cancer Staging   Staging  form: Breast, AJCC 8th Edition - Pathologic stage from 07/22/2022: Stage IA (pT1a, pN0, cM0, G1, ER+, PR+, HER2-) - Signed by Eppie Gibson, MD on 07/22/2022 Stage prefix: Initial diagnosis Method of lymph node assessment: Clinical Histologic grading system: 3 grade system    Interval history  She didn't answer  MEDICAL HISTORY:  Past Medical History:  Diagnosis Date   Arthritis    hips, hands   Cancer ([place]) 05/2022   left breast IDC   Colon polyps    Hypothyroidism    Osteoporosis    PONV (postoperative nausea and vomiting)     SURGICAL HISTORY: Past Surgical History:  Procedure Laterality Date   BREAST LUMPECTOMY WITH RADIOACTIVE SEED LOCALIZATION Left 06/26/2022   Procedure: LEFT BREAST LUMPECTOMY WITH RADIOACTIVE SEED LOCALIZATION;  Surgeon: Erroll Luna, MD;  Location: [facility];  Service: General;  Laterality: Left;   CERVICAL LAMINECTOMY     TUBAL LIGATION      SOCIAL HISTORY: Social History   Socioeconomic History   Marital status: Married    Spouse name: Not on file   Number of children: Not on file   Years of education: Not on file   Highest education level: Not on file  Occupational History   Not on file  Tobacco Use   Smoking status: Former    Types: Cigarettes    Quit date: 1980    Years since quitting: 44.0   Smokeless tobacco: Never  Vaping Use   Vaping Use: Never used  Substance and Sexual Activity   Alcohol use: Yes    Comment: 1 drink daily   Drug use: Never   Sexual activity: Yes    Birth control/protection: Post-menopausal  Other Topics Concern   Not on file  Social History Narrative   Not on  file   Social Determinants of Health   Financial Resource Strain: Low Risk  (06/13/2022)   Overall Financial Resource Strain (CARDIA)    Difficulty of Paying Living Expenses: Not hard at all  Food Insecurity: No Food Insecurity (06/13/2022)   Hunger Vital Sign    Worried About Running Out of Food in the Last Year: Never true     Ran Out of Food in the Last Year: Never true  Transportation Needs: No Transportation Needs (06/13/2022)   PRAPARE - Hydrologist (Medical): No    Lack of Transportation (Non-Medical): No  Physical Activity: Not on file  Stress: Not on file  Social Connections: Not on file  Intimate Partner Violence: Not on file    FAMILY HISTORY: Family History  Problem Relation Age of Onset   Breast cancer Mother    Diabetes Mother    Colon cancer Neg Hx    Esophageal cancer Neg Hx    Pancreatic cancer Neg Hx    Stomach cancer Neg Hx    Liver disease Neg Hx     ALLERGIES:  is allergic to dust mite extract.  MEDICATIONS:  Current Outpatient Medications  Medication Sig Dispense Refill   alendronate (FOSAMAX) 70 MG tablet Take 70 mg by mouth once a week.     anastrozole (ARIMIDEX) 1 MG tablet Take 1 tablet (1 mg total) by mouth daily. 90 tablet 3   aspirin EC 81 MG tablet Take 81 mg by mouth daily. Swallow whole.     Cholecalciferol (D3-1000) 25 MCG (1000 UT) capsule Take 1,000 Units by mouth at bedtime.     Cyanocobalamin (CVS B12 QUICK DISSOLVE PO) Take 5,000 mcg by mouth daily.     levothyroxine (SYNTHROID) 75 MCG tablet Take 75 mcg by mouth daily.     lovastatin (MEVACOR) 40 MG tablet Take 40 mg by mouth at bedtime.     Magnesium 500 MG CAPS Take by mouth.     Multiple Vitamins-Minerals (MULTIVITAMIN ADULTS 50+ PO) Take 1 tablet by mouth daily.     Omega-3 Fatty Acids (FISH OIL) 1000 MG CAPS Take 1 capsule by mouth daily.     TURMERIC CURCUMIN PO Take 1 tablet by mouth daily.     No current facility-administered medications for this visit.    REVIEW OF SYSTEMS:   Constitutional: Denies fevers, chills or abnormal night sweats Eyes: Denies blurriness of vision, double vision or watery eyes Ears, nose, mouth, throat, and face: Denies mucositis or sore throat Respiratory: Denies cough, dyspnea or wheezes Cardiovascular: Denies palpitation, chest discomfort  or lower extremity swelling Gastrointestinal:  Denies nausea, heartburn or change in bowel habits Skin: Denies abnormal skin rashes Lymphatics: Denies new lymphadenopathy or easy bruising Neurological:Denies numbness, tingling or new weaknesses Behavioral/Psych: Mood is stable, no new changes  Breast: Denies any palpable lumps or discharge All other systems were reviewed with the patient and are negative.  PHYSICAL EXAMINATION: ECOG PERFORMANCE STATUS: 0 - Asymptomatic  There were no vitals filed for this visit.   There were no vitals filed for this visit.   Physical exam deferred in lieu of telephone visit.  LABORATORY DATA:  I have reviewed the data as listed No results found for: "WBC", "HGB", "HCT", "MCV", "PLT" No results found for: "NA", "K", "CL", "CO2"  RADIOGRAPHIC STUDIES: I have personally reviewed the radiological reports and agreed with the findings in the report.  ASSESSMENT AND PLAN:  No problem-specific Assessment & Plan notes found for this encounter.  Called her twice, no response.  All questions were answered. The patient knows to call the clinic with any problems, questions or concerns.    Benay Pike, MD 11/20/22

## 2022-11-20 NOTE — Telephone Encounter (Signed)
Called pt to f/u on Anastrozole side effects. Pt stated that diarrhea has resolved since stopping Anastrozole. Requested Dr. Chryl Heck advised what to take in place. Also, stated that she can continue on Anastrozole if recommended. Per Dr.Iruku, prescribed pt Aromasin and advised to start taking after Christmas. Pt verbalized understanding.

## 2022-11-25 ENCOUNTER — Other Ambulatory Visit: Payer: Self-pay | Admitting: *Deleted

## 2022-11-25 MED ORDER — EXEMESTANE 25 MG PO TABS: 25.0000 mg | ORAL_TABLET | Freq: Every day | ORAL | 1 refills | Status: DC

## 2022-12-02 ENCOUNTER — Inpatient Hospital Stay: Payer: Medicare Other | Attending: Hematology and Oncology | Admitting: Hematology and Oncology

## 2022-12-02 ENCOUNTER — Encounter: Payer: Self-pay | Admitting: Hematology and Oncology

## 2022-12-02 DIAGNOSIS — C50512 Malignant neoplasm of lower-outer quadrant of left female breast: Secondary | ICD-10-CM

## 2022-12-02 DIAGNOSIS — Z170 Estrogen receptor positive status [ER+]: Secondary | ICD-10-CM | POA: Diagnosis not present

## 2022-12-02 NOTE — Progress Notes (Signed)
[place] CONSULT NOTE  Patient Care Team: Nicholes Rough, PA-C as PCP - General (Physician Assistant) Rockwell Germany, RN as Oncology Nurse Navigator Mauro Kaufmann, RN as Oncology Nurse Navigator Benay Pike, MD as Consulting Physician (Hematology and Oncology) Eppie Gibson, MD as Attending Physician (Radiation Oncology) Erroll Luna, MD as Consulting Physician (General Surgery)  CHIEF COMPLAINTS/PURPOSE OF CONSULTATION:  Newly diagnosed breast cancer  HISTORY OF PRESENTING ILLNESS:  Kristin Molina 77 y.o. female is here because of recent diagnosis of left breast cancer.  I reviewed her records extensively and collaborated the history with the patient.  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  Diagnostic mammogram showed indeterminate findings suspicious mass without sonographic correlate in the central left breast.   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   07/22/2022 Cancer Staging   Staging  form: Breast, AJCC 8th Edition - Pathologic stage from 07/22/2022: Stage IA (pT1a, pN0, cM0, G1, ER+, PR+, HER2-) - Signed by Eppie Gibson, MD on 07/22/2022 Stage prefix: Initial diagnosis Method of lymph node assessment: Clinical Histologic grading system: 3 grade system    Interval history  [name] is here for telephone follow-up.  Since her last visit with [place], she started taking exemestane.  She is now very constipated.  She has been taking over-the-counter stool softeners and laxatives.  She has been passing small bowel movements and gas.  She has some abdominal discomfort but not intolerable.  No nausea or vomiting.  No fever or chills.  She is hoping to go back on anastrozole since exemestane is pretty expensive.  Rest of the pertinent 10 point ROS reviewed and negative  MEDICAL HISTORY:  Past Medical History:  Diagnosis Date   Arthritis    hips, hands   Cancer ([place]) 05/2022   left breast IDC   Colon polyps    Hypothyroidism    Osteoporosis    PONV (postoperative nausea and vomiting)     SURGICAL HISTORY: Past Surgical History:  Procedure Laterality Date   BREAST LUMPECTOMY WITH RADIOACTIVE SEED LOCALIZATION Left 06/26/2022   Procedure: LEFT BREAST LUMPECTOMY WITH RADIOACTIVE SEED LOCALIZATION;  Surgeon: Erroll Luna, MD;  Location: [facility];  Service: General;  Laterality: Left;   CERVICAL LAMINECTOMY     TUBAL LIGATION      SOCIAL HISTORY: Social History   Socioeconomic History   Marital status: Married    Spouse name: Not on file   Number of children: Not on file   Years of education: Not on file   Highest education level: Not on file  Occupational History   Not on file  Tobacco Use  Smoking status: Former    Types: Cigarettes    Quit date: 1980    Years since quitting: 44.0   Smokeless tobacco: Never  Vaping Use   Vaping Use: Never used  Substance and Sexual Activity   Alcohol use: Yes    Comment: 1 drink daily   Drug use: Never    Sexual activity: Yes    Birth control/protection: Post-menopausal  Other Topics Concern   Not on file  Social History Narrative   Not on file   Social Determinants of Health   Financial Resource Strain: Low Risk  (06/13/2022)   Overall Financial Resource Strain (CARDIA)    Difficulty of Paying Living Expenses: Not hard at all  Food Insecurity: No Food Insecurity (06/13/2022)   Hunger Vital Sign    Worried About Running Out of Food in the Last Year: Never true    Ran Out of Food in the Last Year: Never true  Transportation Needs: No Transportation Needs (06/13/2022)   PRAPARE - Hydrologist (Medical): No    Lack of Transportation (Non-Medical): No  Physical Activity: Not on file  Stress: Not on file  Social Connections: Not on file  Intimate Partner Violence: Not on file    FAMILY HISTORY: Family History  Problem Relation Age of Onset   Breast cancer Mother    Diabetes Mother    Colon cancer Neg Hx    Esophageal cancer Neg Hx    Pancreatic cancer Neg Hx    Stomach cancer Neg Hx    Liver disease Neg Hx     ALLERGIES:  is allergic to dust mite extract.  MEDICATIONS:  Current Outpatient Medications  Medication Sig Dispense Refill   alendronate (FOSAMAX) 70 MG tablet Take 70 mg by mouth once a week.     aspirin EC 81 MG tablet Take 81 mg by mouth daily. Swallow whole.     Cholecalciferol (D3-1000) 25 MCG (1000 UT) capsule Take 1,000 Units by mouth at bedtime.     Cyanocobalamin (CVS B12 QUICK DISSOLVE PO) Take 5,000 mcg by mouth daily.     exemestane (AROMASIN) 25 MG tablet Take 1 tablet (25 mg total) by mouth daily after breakfast. 30 tablet 1   levothyroxine (SYNTHROID) 75 MCG tablet Take 75 mcg by mouth daily.     lovastatin (MEVACOR) 40 MG tablet Take 40 mg by mouth at bedtime.     Magnesium 500 MG CAPS Take by mouth.     Multiple Vitamins-Minerals (MULTIVITAMIN ADULTS 50+ PO) Take 1 tablet by mouth daily.     Omega-3 Fatty Acids (FISH  OIL) 1000 MG CAPS Take 1 capsule by mouth daily.     TURMERIC CURCUMIN PO Take 1 tablet by mouth daily.     No current facility-administered medications for this visit.      PHYSICAL EXAMINATION: ECOG PERFORMANCE STATUS: 0 - Asymptomatic   Physical exam deferred in lieu of telephone visit.  LABORATORY DATA:  I have reviewed the data as listed No results found for: "WBC", "HGB", "HCT", "MCV", "PLT" No results found for: "NA", "K", "CL", "CO2"  RADIOGRAPHIC STUDIES: I have personally reviewed the radiological reports and agreed with the findings in the report.  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  We have reviewed that this is a low-grade invasive ductal carcinoma measuring 3 mm, negative margins.  No sentinel lymph node submitted. We have reviewed role of adjuvant radiation and antiestrogen therapy.  Since her last visit, she stopped anastrozole and switch to exemestane.  She now has been dealing with constipation and has been using multiple over-the-counter stool softeners and laxatives.  She is hoping to go back on anastrozole since exemestane is also expensive.  I think this is reasonable.  Restart anastrozole.  I have encouraged her to give [place] a call back later this week if she still has worsening constipation.  She has no severe abdominal pain or nausea or vomiting at this time.  I connected with  Kristin Molina on 12/02/22 by a telephone application and verified that I am speaking with the correct person using two identifiers.   I discussed the  limitations of evaluation and management by telemedicine. The patient expressed understanding and agreed to proceed.  Total time spent: 12 minutes including history, review of records, counseling, coordination of care and documentation  All questions were answered. The patient knows to call the clinic with any problems, questions or concerns.    Benay Pike, MD 12/02/22

## 2022-12-02 NOTE — Assessment & Plan Note (Addendum)
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  We have reviewed that this is a low-grade invasive ductal carcinoma measuring 3 mm, negative margins.  No sentinel lymph node submitted. We have reviewed role of adjuvant radiation and antiestrogen therapy.  Since her last visit, she stopped anastrozole and switch to exemestane.  She now has been dealing with constipation and has been using multiple over-the-counter stool softeners and laxatives.  She is hoping to go back on anastrozole since exemestane is also expensive.  I think this is reasonable.  Restart anastrozole.  I have encouraged her to give [place] a call back later this week if she still has worsening constipation.  She has no severe abdominal pain or nausea or vomiting at this time.

## 2023-01-26 ENCOUNTER — Encounter: Payer: Self-pay | Admitting: *Deleted

## 2023-01-27 ENCOUNTER — Encounter: Payer: Self-pay | Admitting: *Deleted

## 2023-01-27 ENCOUNTER — Telehealth: Payer: Self-pay | Admitting: Hematology and Oncology

## 2023-01-27 NOTE — Telephone Encounter (Signed)
Per 2/27 IB reached out to schedule appointment, patient aware of time and date of appointment,

## 2023-01-27 NOTE — Progress Notes (Signed)
This RN  called pt to see if she was able to restart the anastrozole. She restarted medication about a one month ago and things are going well. She has been having good bowel movements and has not had to take anything to aid her in having a bowel movement in a couple of weeks now. She feels like she is doing very good overall on the anastrozole. We will see her in April for SCP appt.

## 2023-03-09 ENCOUNTER — Inpatient Hospital Stay: Payer: Medicare Other | Attending: Hematology and Oncology | Admitting: Adult Health

## 2023-03-09 ENCOUNTER — Encounter: Payer: Self-pay | Admitting: Adult Health

## 2023-03-09 ENCOUNTER — Encounter: Payer: Medicare Other | Admitting: Adult Health

## 2023-03-09 VITALS — BP 133/84 | HR 61 | Temp 98.1°F | Resp 18 | Ht 62.0 in | Wt 131.9 lb

## 2023-03-09 DIAGNOSIS — Z87891 Personal history of nicotine dependence: Secondary | ICD-10-CM | POA: Diagnosis not present

## 2023-03-09 DIAGNOSIS — M85852 Other specified disorders of bone density and structure, left thigh: Secondary | ICD-10-CM | POA: Diagnosis not present

## 2023-03-09 DIAGNOSIS — Z79811 Long term (current) use of aromatase inhibitors: Secondary | ICD-10-CM | POA: Insufficient documentation

## 2023-03-09 DIAGNOSIS — Z923 Personal history of irradiation: Secondary | ICD-10-CM | POA: Diagnosis not present

## 2023-03-09 DIAGNOSIS — Z170 Estrogen receptor positive status [ER+]: Secondary | ICD-10-CM | POA: Diagnosis not present

## 2023-03-09 DIAGNOSIS — C50512 Malignant neoplasm of lower-outer quadrant of left female breast: Secondary | ICD-10-CM | POA: Insufficient documentation

## 2023-03-09 NOTE — Progress Notes (Signed)
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  Diagnostic mammogram showed indeterminate findings suspicious mass without sonographic correlate in the central left breast.   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   07/22/2022 Cancer Staging   Staging form: Breast, AJCC 8th Edition - Pathologic stage from 07/22/2022: Stage IA (pT1a, pN0, cM0, G1, ER+, PR+, HER2-) - Signed by Lonie Peak, MD on 07/22/2022 Stage prefix: Initial diagnosis Method of lymph node assessment: Clinical Histologic grading system: 3 grade system   07/29/2022 - 08/19/2022 Radiation Therapy   Site Technique Total Dose (Gy) Dose per Fx (Gy) Completed Fx Beam Energies  Breast, Left: Breast_L 3D 40.05/40.05 2.67 15/15 6XFFF     08/2022 -  Anti-estrogen oral therapy   Anastrozole     INTERVAL HISTORY:  Kristin Molina to review her survivorship care plan detailing her treatment course for breast cancer, as well as monitoring long-term side effects of  that treatment, education regarding health maintenance, screening, and overall wellness and health promotion.     Overall, Kristin Molina reports feeling quite well. Taking anastrozole daily.  She is tolerating it well.  Initially she thought she was not tolerating it very well but changed to Exemestane and did poorly, so she is back on Anastrozole with good tolerance.    Bone density testing 04/07/2022 showed T score of -2.3 she is taking fosamax weekly with good tolerance.  She is a former smoker quit over 40 years ago.  REVIEW OF SYSTEMS:  Review of Systems  Constitutional:  Negative for appetite change, chills, fatigue, fever and unexpected weight change.  HENT:   Negative for hearing loss, lump/mass and trouble swallowing.   Eyes:  Negative for eye problems and icterus.  Respiratory:  Negative for chest tightness, cough and shortness of breath.   Cardiovascular:  Negative for chest pain, leg swelling and palpitations.  Gastrointestinal:  Negative for abdominal distention, abdominal pain, constipation, diarrhea, nausea and vomiting.  Endocrine: Negative for hot flashes.  Genitourinary:  Negative for difficulty urinating.   Musculoskeletal:  Negative for arthralgias.  Skin:  Negative for itching and rash.  Neurological:  Negative for dizziness, extremity weakness, headaches and numbness.  Hematological:  Negative for adenopathy. Does not bruise/bleed easily.  Psychiatric/Behavioral:  Negative for depression. The patient is not nervous/anxious.   Breast: Denies any new nodularity, masses, tenderness, nipple changes, or nipple discharge.       PAST MEDICAL/SURGICAL HISTORY:  Past Medical History:  Diagnosis Date   Arthritis    hips, hands   Cancer ([facility]) 05/2022   left breast IDC   Colon polyps    Hypothyroidism    Osteoporosis  PONV (postoperative nausea and vomiting)    Past Surgical History:  Procedure Laterality Date   BREAST LUMPECTOMY WITH RADIOACTIVE SEED LOCALIZATION  Left 06/26/2022   Procedure: LEFT BREAST LUMPECTOMY WITH RADIOACTIVE SEED LOCALIZATION;  Surgeon: Harriette Bouillonornett, Thomas, MD;  Location: [facility];  Service: General;  Laterality: Left;   CERVICAL LAMINECTOMY     TUBAL LIGATION       ALLERGIES:  Allergies  Allergen Reactions   Dust Mite Extract Cough     CURRENT MEDICATIONS:  Outpatient Encounter Medications as of 03/09/2023  Medication Sig   albuterol (VENTOLIN HFA) 108 (90 Base) MCG/ACT inhaler Inhale 1 puff into the lungs every 6 (six) hours as needed.   alendronate (FOSAMAX) 70 MG tablet Take 70 mg by mouth once a week.   anastrozole (ARIMIDEX) 1 MG tablet Take 1 mg by mouth daily.   aspirin EC 81 MG tablet Take 81 mg by mouth daily. Swallow whole.   Cholecalciferol (D3-1000) 25 MCG (1000 UT) capsule Take 1,000 Units by mouth at bedtime.   Cyanocobalamin (CVS B12 QUICK DISSOLVE PO) Take 5,000 mcg by mouth daily.   levothyroxine (SYNTHROID) 75 MCG tablet Take 75 mcg by mouth daily.   lovastatin (MEVACOR) 40 MG tablet Take 40 mg by mouth at bedtime.   Magnesium 500 MG CAPS Take by mouth.   Multiple Vitamins-Minerals (MULTIVITAMIN ADULTS 50+ PO) Take 1 tablet by mouth daily.   Omega-3 Fatty Acids (FISH OIL) 1000 MG CAPS Take 1 capsule by mouth daily.   TURMERIC CURCUMIN PO Take 1 tablet by mouth daily.   [DISCONTINUED] exemestane (AROMASIN) 25 MG tablet Take 1 tablet (25 mg total) by mouth daily after breakfast.   No facility-administered encounter medications on file as of 03/09/2023.     ONCOLOGIC FAMILY HISTORY:  Family History  Problem Relation Age of Onset   Breast cancer Mother    Diabetes Mother    Colon cancer Neg Hx    Esophageal cancer Neg Hx    Pancreatic cancer Neg Hx    Stomach cancer Neg Hx    Liver disease Neg Hx      SOCIAL HISTORY:  Social History   Socioeconomic History   Marital status: Married    Spouse name: Not on file   Number of children: Not on file   Years of education: Not on file    Highest education level: Not on file  Occupational History   Not on file  Tobacco Use   Smoking status: Former    Types: Cigarettes    Quit date: 1980    Years since quitting: 44.2   Smokeless tobacco: Never  Vaping Use   Vaping Use: Never used  Substance and Sexual Activity   Alcohol use: Yes    Comment: 1 drink daily   Drug use: Never   Sexual activity: Yes    Birth control/protection: Post-menopausal  Other Topics Concern   Not on file  Social History Narrative   Not on file   Social Determinants of Health   Financial Resource Strain: Low Risk  (06/13/2022)   Overall Financial Resource Strain (CARDIA)    Difficulty of Paying Living Expenses: Not hard at all  Food Insecurity: No Food Insecurity (06/13/2022)   Hunger Vital Sign    Worried About Running Out of Food in the Last Year: Never true    Ran Out of Food in the Last Year: Never true  Transportation Needs: No Transportation Needs (06/13/2022)   PRAPARE - Transportation  Lack of Transportation (Medical): No    Lack of Transportation (Non-Medical): No  Physical Activity: Not on file  Stress: Not on file  Social Connections: Not on file  Intimate Partner Violence: Not on file     OBSERVATIONS/OBJECTIVE:  BP 133/84 (BP Location: Left Arm, Patient Position: Sitting)   Pulse 61   Temp 98.1 F (36.7 C) (Temporal)   Resp 18   Ht  (1.575 m)   Wt 131 lb 14.4 oz (59.8 kg)   SpO2 96%   BMI 24.12 kg/m  GENERAL: Patient is a well appearing female in no acute distress HEENT:  Sclerae anicteric.  Oropharynx clear and moist. No ulcerations or evidence of oropharyngeal candidiasis. Neck is supple.  NODES:  No cervical, supraclavicular, or axillary lymphadenopathy palpated.  BREAST EXAM: Left breast status post lumpectomy and radiation no sign of local recurrence right breast is benign LUNGS:  Clear to auscultation bilaterally.  No wheezes or rhonchi. HEART:  Regular rate and rhythm. No murmur  appreciated. ABDOMEN:  Soft, nontender.  Positive, normoactive bowel sounds. No organomegaly palpated. MSK:  No focal spinal tenderness to palpation. Full range of motion bilaterally in the upper extremities. EXTREMITIES:  No peripheral edema.   SKIN:  Clear with no obvious rashes or skin changes. No nail dyscrasia. NEURO:  Nonfocal. Well oriented.  Appropriate affect.   LABORATORY DATA:  None for this visit.  DIAGNOSTIC IMAGING:  None for this visit.      ASSESSMENT AND PLAN:  Ms.. Molina is a pleasant 77 y.o. female with Stage 1A left breast invasive ductal carcinoma, ER+/PR+/HER2-, diagnosed in 05/2022, treated with lumpectomy, adjuvant radiation therapy, and anti-estrogen therapy with anastrozole beginning in 08/2022.  She presents to the Survivorship Clinic for our initial meeting and routine follow-up post-completion of treatment for breast cancer.    1. Stage 1A left breast cancer:  Kristin Molina is continuing to recover from definitive treatment for breast cancer. She will follow-up with her medical oncologist, Dr. Al Pimple in 06/2023 with history and physical exam per surveillance protocol.  She will continue her anti-estrogen therapy with Anastrozole. Thus far, she is tolerating the Anastrozole well, with minimal side effects. She was instructed to make Dr. Pamelia Hoit or myself aware if she begins to experience any worsening side effects of the medication and I could see her back in clinic to help manage those side effects, as needed. Her mammogram is due 04/2023; orders placed today.  Today, a comprehensive survivorship care plan and treatment summary was reviewed with the patient today detailing her breast cancer diagnosis, treatment course, potential late/long-term effects of treatment, appropriate follow-up care with recommendations for the future, and patient education resources.  A copy of this summary, along with a letter will be sent to the patient's primary care provider via mail/fax/In  Basket message after today's visit.    2. Bone health:  Given Kristin Molina's age/history of breast cancer and her current treatment regimen including anti-estrogen therapy with Anastrozole, she is at risk for bone demineralization.  Her last DEXA scan was 03/2022 and demonstrated osteopenia with a T score of -2.3 in the left femur.  She is taking Fosamax weekly with good otlerance and is due for repeat testing in 03/2024.  She was given education on specific activities to promote bone health.  3. Cancer screening:  Due to Kristin Molina's history and her age, she should receive screening for skin cancers, colon cancer.  The information and recommendations are listed on the patient's comprehensive care  plan/treatment summary and were reviewed in detail with the patient.    4. Health maintenance and wellness promotion: Kristin Molina was encouraged to consume 5-7 servings of fruits and vegetables per day. We reviewed the "Nutrition Rainbow" handout.  She was also encouraged to engage in moderate to vigorous exercise for 30 minutes per day most days of the week.   She was instructed to limit her alcohol consumption and continue to abstain from tobacco use.     5. Support services/counseling: It is not uncommon for this period of the patient's cancer care trajectory to be one of many emotions and stressors.  She was given information regarding our available services and encouraged to contact me with any questions or for help enrolling in any of our support group/programs.    Follow up instructions:    -Return to cancer center 06/2023 for f/u with Dr. Al Pimple  -Mammogram due in 04/2023 -Bone density testing due 03/2024 -She is welcome to return back to the Survivorship Clinic at any time; no additional follow-up needed at this time.  -Consider referral back to survivorship as a long-term survivor for continued surveillance  The patient was provided an opportunity to ask questions and all were answered. The  patient agreed with the plan and demonstrated an understanding of the instructions.   Total encounter time:40 minutes*in face-to-face visit time, chart review, lab review, care coordination, order entry, and documentation of the encounter time.    Lillard Anes, NP 03/09/23 11:45 AM Medical Oncology and Hematology [facility] [address] [place], [place] [address] Tel. [phone]    Fax. [phone]  *Total Encounter Time as defined by the Centers for Medicare and Medicaid Services includes, in addition to the face-to-face time of a patient visit (documented in the note above) non-face-to-face time: obtaining and reviewing outside history, ordering and reviewing medications, tests or procedures, care coordination (communications with other health care professionals or caregivers) and documentation in the medical record.

## 2023-04-16 ENCOUNTER — Ambulatory Visit
Admission: RE | Admit: 2023-04-16 | Discharge: 2023-04-16 | Disposition: A | Discharge status: Home / Self Care | Payer: Medicare Other | Source: Ambulatory Visit | Attending: Hematology and Oncology | Admitting: Hematology and Oncology

## 2023-05-13 ENCOUNTER — Telehealth: Payer: Self-pay | Admitting: Hematology and Oncology

## 2023-05-13 NOTE — Telephone Encounter (Signed)
Spoke with patient confirming upcoming appointment change  

## 2023-06-02 ENCOUNTER — Inpatient Hospital Stay: Payer: Medicare Other | Attending: Hematology and Oncology | Admitting: Hematology and Oncology

## 2023-06-02 ENCOUNTER — Other Ambulatory Visit: Payer: Self-pay

## 2023-06-02 VITALS — BP 161/59 | HR 71 | Temp 97.9°F | Resp 18 | Wt 129.8 lb

## 2023-06-02 DIAGNOSIS — R232 Flushing: Secondary | ICD-10-CM | POA: Diagnosis not present

## 2023-06-02 DIAGNOSIS — Z87891 Personal history of nicotine dependence: Secondary | ICD-10-CM | POA: Insufficient documentation

## 2023-06-02 DIAGNOSIS — C50512 Malignant neoplasm of lower-outer quadrant of left female breast: Secondary | ICD-10-CM | POA: Insufficient documentation

## 2023-06-02 DIAGNOSIS — M85852 Other specified disorders of bone density and structure, left thigh: Secondary | ICD-10-CM | POA: Insufficient documentation

## 2023-06-02 DIAGNOSIS — M2550 Pain in unspecified joint: Secondary | ICD-10-CM | POA: Diagnosis not present

## 2023-06-02 DIAGNOSIS — Z803 Family history of malignant neoplasm of breast: Secondary | ICD-10-CM | POA: Insufficient documentation

## 2023-06-02 DIAGNOSIS — Z170 Estrogen receptor positive status [ER+]: Secondary | ICD-10-CM | POA: Diagnosis not present

## 2023-06-02 DIAGNOSIS — Z79811 Long term (current) use of aromatase inhibitors: Secondary | ICD-10-CM | POA: Insufficient documentation

## 2023-06-02 NOTE — Progress Notes (Signed)
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  Diagnostic mammogram showed indeterminate findings suspicious mass without sonographic correlate in the central left breast.   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   07/22/2022 Cancer Staging   Staging form: Breast, AJCC 8th Edition - Pathologic stage from 07/22/2022: Stage IA (pT1a, pN0, cM0, G1, ER+, PR+, HER2-) - Signed by Lonie Peak, MD on 07/22/2022 Stage prefix: Initial diagnosis Method of lymph node assessment: Clinical Histologic grading system: 3 grade system   07/29/2022 - 08/19/2022 Radiation Therapy   Site Technique Total Dose (Gy) Dose per Fx (Gy) Completed Fx Beam Energies  Breast, Left: Breast_L 3D 40.05/40.05 2.67 15/15 6XFFF    08/2022 -  Anti-estrogen oral therapy   Anastrozole     INTERVAL HISTORY:   She is doing really well on anastrozole. She has been having occasional hot flashes, mostly in the early mornings and nights. It is not all the time.She has some  achiness of the joints. She is very active, always doing something. She notices some discomfort in the left breast since surgery, no masses Mammogram May 2024, neg for malignancy. Bone density testing 04/07/2022 showed T score of -2.3 she is taking fosamax weekly with good tolerance.  She is a former smoker quit over 40 years ago.  REVIEW OF SYSTEMS:  Review of Systems  Constitutional:  Negative for appetite change, chills, fatigue, fever and unexpected weight change.  HENT:   Negative for hearing loss, lump/mass and trouble swallowing.   Eyes:  Negative for eye problems and icterus.  Respiratory:  Negative for chest tightness, cough and shortness of breath.   Cardiovascular:  Negative for chest pain, leg swelling and palpitations.  Gastrointestinal:  Negative for abdominal distention, abdominal pain, constipation, diarrhea, nausea and vomiting.  Endocrine: Negative for hot flashes.  Genitourinary:  Negative for difficulty urinating.   Musculoskeletal:  Negative for arthralgias.  Skin:  Negative for itching and rash.  Neurological:  Negative for dizziness, extremity weakness, headaches and numbness.  Hematological:  Negative for adenopathy. Does not bruise/bleed easily.  Psychiatric/Behavioral:  Negative for depression. The patient is not nervous/anxious.   Breast: Denies any new nodularity, masses, tenderness, nipple changes, or nipple discharge.       PAST MEDICAL/SURGICAL HISTORY:  Past Medical History:  Diagnosis Date   Arthritis    hips, hands   Cancer (HCC) 05/2022   left breast IDC   Colon polyps    Hypothyroidism    Osteoporosis    PONV (postoperative nausea and vomiting)    Past Surgical History:  Procedure Laterality Date   BREAST LUMPECTOMY WITH RADIOACTIVE SEED LOCALIZATION Left 06/26/2022   Procedure: LEFT  BREAST LUMPECTOMY WITH RADIOACTIVE SEED LOCALIZATION;  Surgeon: Harriette Bouillon, MD;  Location: [facility];  Service: General;  Laterality: Left;    CERVICAL LAMINECTOMY     TUBAL LIGATION       ALLERGIES:  Allergies  Allergen Reactions   Dust Mite Extract Cough     CURRENT MEDICATIONS:  Outpatient Encounter Medications as of 06/02/2023  Medication Sig   albuterol (VENTOLIN HFA) 108 (90 Base) MCG/ACT inhaler Inhale 1 puff into the lungs every 6 (six) hours as needed.   alendronate (FOSAMAX) 70 MG tablet Take 70 mg by mouth once a week.   anastrozole (ARIMIDEX) 1 MG tablet Take 1 mg by mouth daily.   aspirin EC 81 MG tablet Take 81 mg by mouth daily. Swallow whole.   Cholecalciferol (D3-1000) 25 MCG (1000 UT) capsule Take 1,000 Units by mouth at bedtime.   Cyanocobalamin ([employer]S B12 QUICK DISSOLVE PO) Take 5,000 mcg by mouth daily.   levothyroxine (SYNTHROID) 75 MCG tablet Take 75 mcg by mouth daily.   lovastatin (MEVACOR) 40 MG tablet Take 40 mg by mouth at bedtime.   Magnesium 500 MG CAPS Take by mouth.   Multiple Vitamins-Minerals (MULTIVITAMIN ADULTS 50+ PO) Take 1 tablet by mouth daily.   Omega-3 Fatty Acids (FISH OIL) 1000 MG CAPS Take 1 capsule by mouth daily.   TURMERIC CURCUMIN PO Take 1 tablet by mouth daily.   No facility-administered encounter medications on file as of 06/02/2023.     ONCOLOGIC FAMILY HISTORY:  Family History  Problem Relation Age of Onset   Breast cancer Mother    Diabetes Mother    Colon cancer Neg Hx    Esophageal cancer Neg Hx    Pancreatic cancer Neg Hx    Stomach cancer Neg Hx    Liver disease Neg Hx      SOCIAL HISTORY:  Social History   Socioeconomic History   Marital status: Married    Spouse name: Not on file   Number of children: Not on file   Years of education: Not on file   Highest education level: Not on file  Occupational History   Not on file  Tobacco Use   Smoking status: Former    Types: Cigarettes    Quit date: 1980    Years since quitting: 44.5   Smokeless tobacco: Never  Vaping Use   Vaping Use: Never used  Substance and Sexual Activity   Alcohol use:  Yes    Comment: 1 drink daily   Drug use: Never   Sexual activity: Yes    Birth control/protection: Post-menopausal  Other Topics Concern   Not on file  Social History Narrative   Not on file   Social Determinants of Health   Financial Resource Strain: Low Risk  (06/13/2022)   Overall Financial Resource Strain (CARDIA)    Difficulty of Paying Living Expenses: Not hard at all  Food Insecurity: No Food Insecurity (06/13/2022)   Hunger Vital Sign    Worried About Running Out of Food in the Last Year: Never true    Ran Out of Food in the Last Year: Never true  Transportation Needs: No Transportation Needs (06/13/2022)   PRAPARE - Administrator, Civil Service (Medical): No    Lack of Transportation (Non-Medical): No  Physical Activity: Not on file  Stress: Not on file  Social Connections: Not on file  Intimate Partner Violence: Not on file     OBSERVATIONS/OBJECTIVE:  BP (!) 161/59 (BP Location:  Left Arm, Patient Position: Sitting)   Pulse 71   Temp 97.9 F (36.6 C) (Temporal)   Resp 18   Wt 129 lb 12.8 oz (58.9 kg)   SpO2 98%   BMI 23.74 kg/m  GENERAL: Patient is a well appearing female in no acute distress HEENT:  Sclerae anicteric.  Oropharynx clear and moist. No ulcerations or evidence of oropharyngeal candidiasis. Neck is supple.  NODES:  No cervical, supraclavicular, or axillary lymphadenopathy palpated.  BREAST EXAM: Left breast status post lumpectomy and radiation no sign of local recurrence right breast is benign LUNGS:  Clear to auscultation bilaterally.  No wheezes or rhonchi. HEART:  Regular rate and rhythm. No murmur appreciated. ABDOMEN:  Soft, nontender.  Positive, normoactive bowel sounds. No organomegaly palpated. MSK:  No focal spinal tenderness to palpation. Full range of motion bilaterally in the upper extremities. EXTREMITIES:  No peripheral edema.   SKIN:  Clear with no obvious rashes or skin changes. No nail dyscrasia. NEURO:  Nonfocal.  Well oriented.  Appropriate affect.   LABORATORY DATA:  None for this visit.  DIAGNOSTIC IMAGING:  None for this visit.      ASSESSMENT AND PLAN:  Ms.. Giannini is a pleasant 77 y.o. female with Stage 1A left breast invasive ductal carcinoma, ER+/PR+/HER2-, diagnosed in 05/2022, treated with lumpectomy, adjuvant radiation therapy, and anti-estrogen therapy with anastrozole beginning in 08/2022.   Her last DEXA scan was 03/2022 and demonstrated osteopenia with a T score of -2.3 in the left femur.  She is taking Fosamax weekly with good tolerance and is due for repeat testing in 03/2024.   She is tolerating anastrozole really well so far except for mild arthralgias and hot flashes.  No concerns on exam, left breast status post surgical changes.  Most recent mammogram with no evidence of malignancy.  She is very active, engages in regular physical activity.  At this time she will continue anastrozole and return to clinic for follow-up in 1 year, she is scheduled to see Dr Luisa Hart in February.  The patient was provided an opportunity to ask questions and all were answered. The patient agreed with the plan and demonstrated an understanding of the instructions.   Total encounter time:30 minutes*in face-to-face visit time, chart review, lab review, care coordination, order entry, and documentation of the encounter time.    *Total Encounter Time as defined by the Centers for Medicare and Medicaid Services includes, in addition to the face-to-face time of a patient visit (documented in the note above) non-face-to-face time: obtaining and reviewing outside history, ordering and reviewing medications, tests or procedures, care coordination (communications with other health care professionals or caregivers) and documentation in the medical record.

## 2023-06-03 ENCOUNTER — Telehealth: Payer: Self-pay | Admitting: Hematology and Oncology

## 2023-06-03 NOTE — Telephone Encounter (Signed)
Spoke with patient confirming upcoming appointment  

## 2023-06-05 ENCOUNTER — Inpatient Hospital Stay: Payer: Medicare Other | Admitting: Hematology and Oncology

## 2023-06-13 ENCOUNTER — Other Ambulatory Visit: Payer: Self-pay | Admitting: Hematology and Oncology

## 2023-10-01 ENCOUNTER — Other Ambulatory Visit: Payer: Self-pay | Admitting: *Deleted

## 2023-10-01 MED ORDER — ANASTROZOLE 1 MG PO TABS: 1.0000 mg | ORAL_TABLET | Freq: Every day | ORAL | 3 refills | Status: DC

## 2023-10-05 IMAGING — MG MM DIGITAL DIAGNOSTIC UNILAT*L* W/ TOMO W/ CAD
6 of 10 series · 6 of 30 positions shown · non-contrast
Comparison: Previous exam(s).

CLINICAL DATA: 75-year-old female presenting as a recall from
screening for possible left breast mass.

EXAM:
DIGITAL DIAGNOSTIC UNILATERAL LEFT MAMMOGRAM WITH TOMOSYNTHESIS AND
CAD; ULTRASOUND LEFT BREAST LIMITED
TECHNIQUE: Left digital diagnostic mammography and breast tomosynthesis was
performed. The images were evaluated with computer-aided detection.;
Targeted ultrasound examination of the left breast was performed.

[L CC synth-2D]
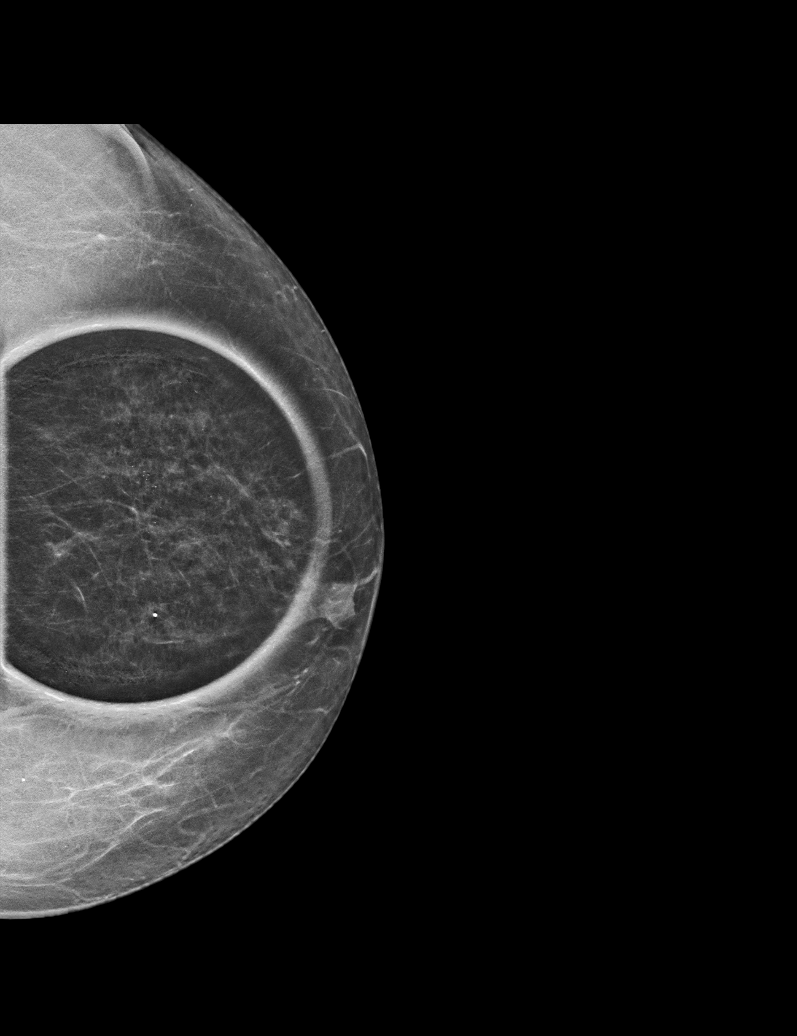

[L ML synth-2D]
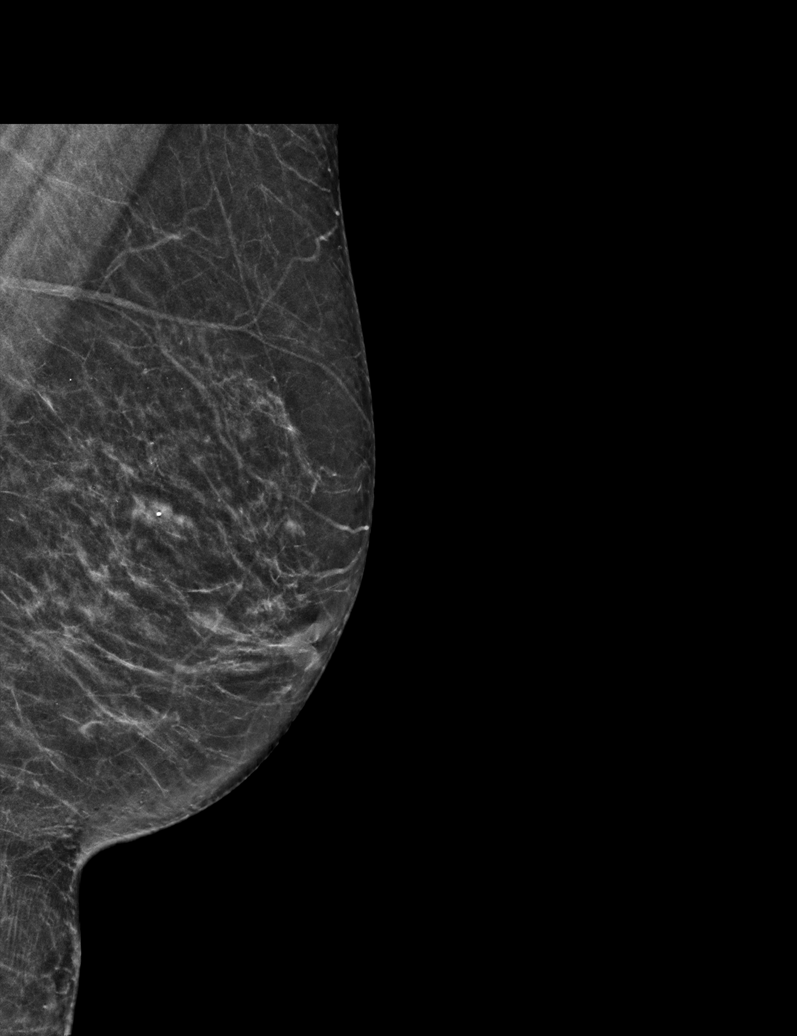

[L XCCM synth-2D]
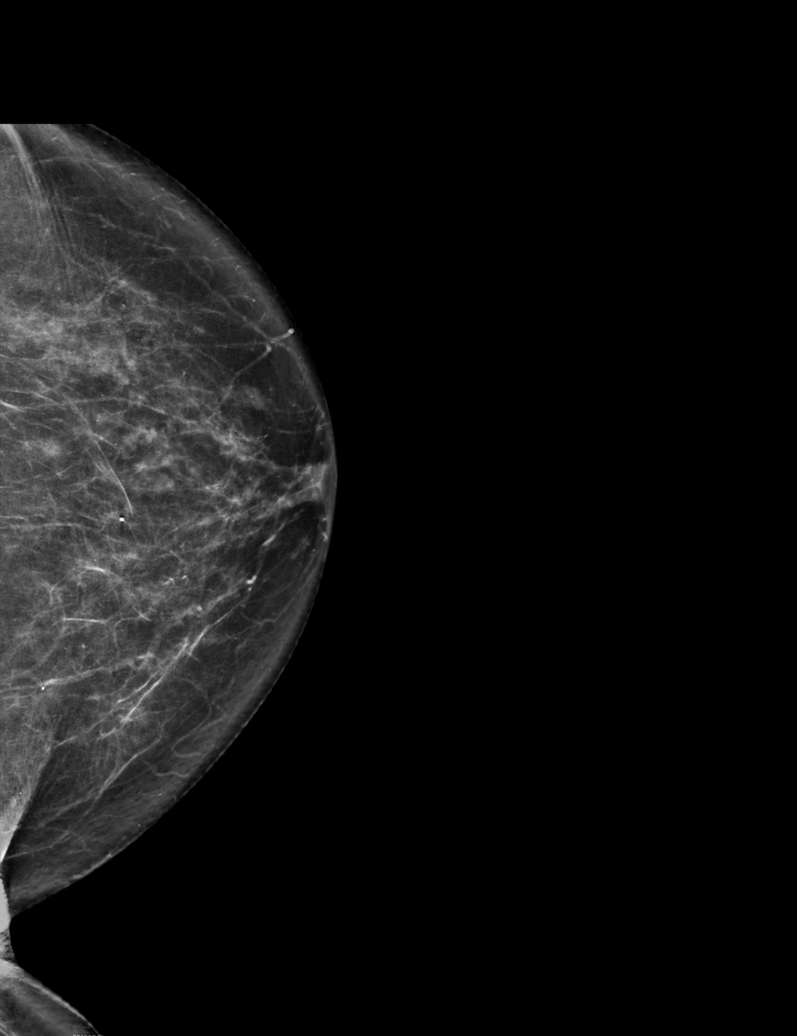

[L MLO synth-2D]
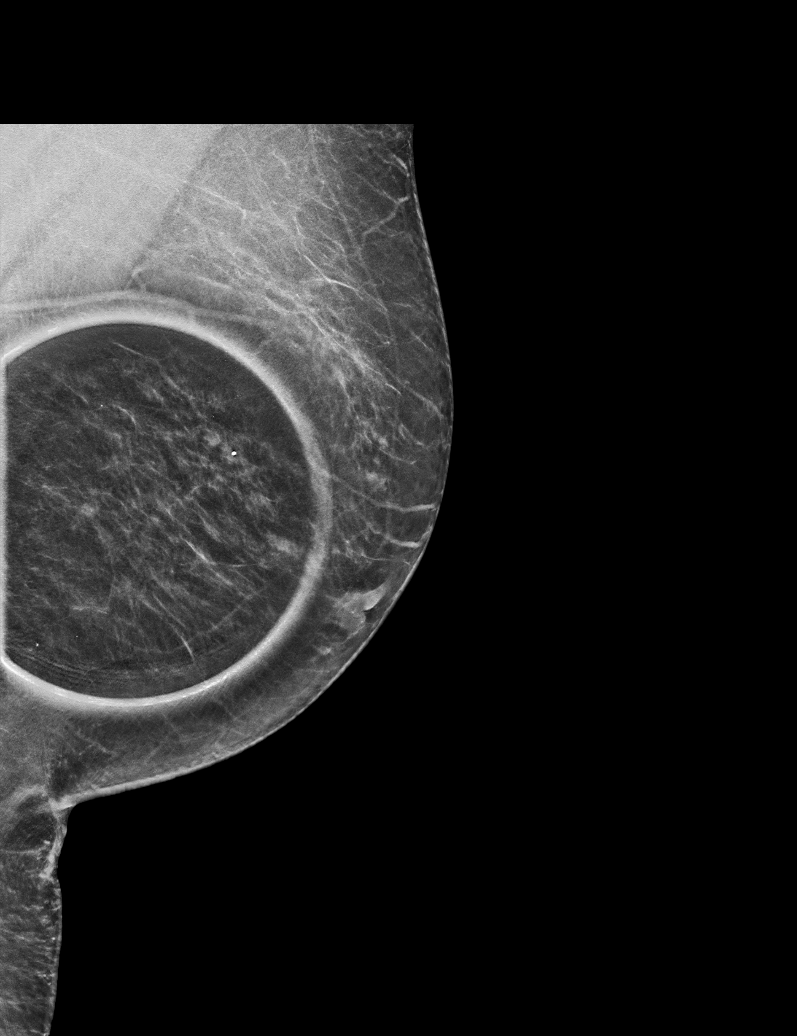

[L XCCL synth-2D]
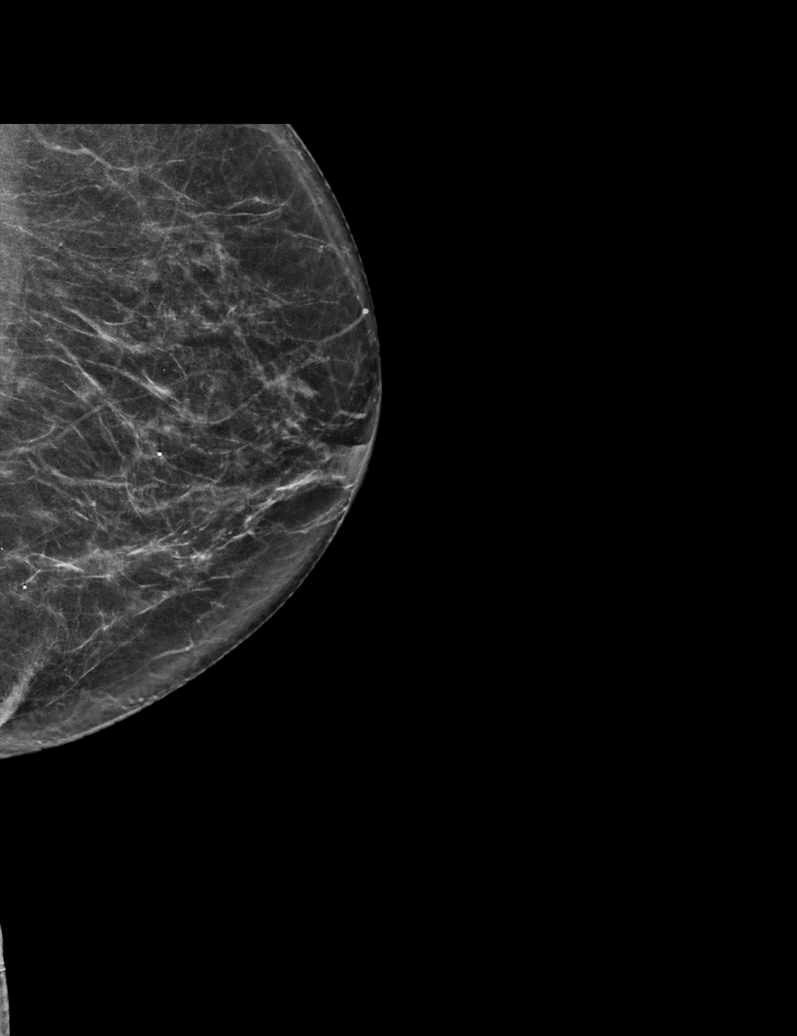

[L XCCL tomo · tomo slice 33/64.0]
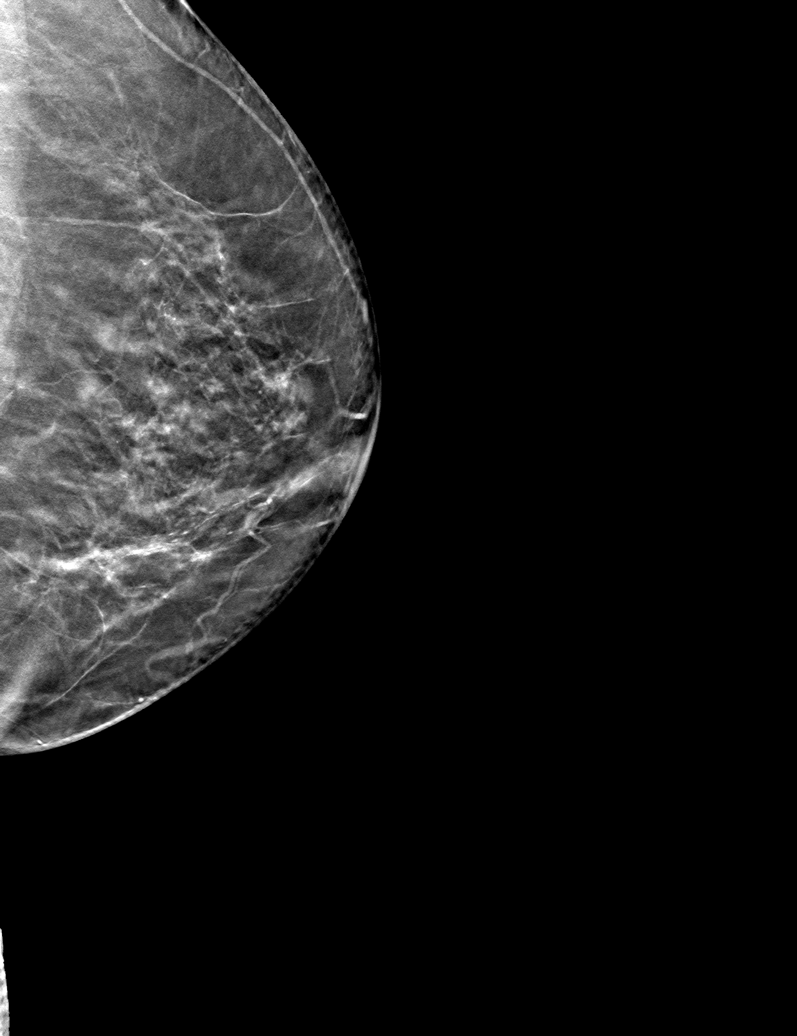

[6 of 30 positions shown; findings below may reference images not displayed]

ACR Breast Density Category b: There are scattered areas of
fibroglandular density.
FINDINGS: Mammogram:

Left breast: Spot compression tomosynthesis views and full field CC
rolled and true lateral views were performed. There is persistence
of a tiny 3 mm mass with possible subtle distortion in the central
posterior left breast.

Ultrasound:

Targeted ultrasound performed throughout the retroareolar aspect of
the left breast demonstrating a likely incidental tiny cyst at 12
o'clock retroareolar. No additional cystic or solid mass identified
to correspond to the mammographic finding.

Targeted ultrasound of the left axilla demonstrates normal lymph
nodes.
IMPRESSION: Indeterminate tiny suspicious mass without sonographic correlate in
the central left breast.

RECOMMENDATION:
Stereotactic core needle biopsy of the left breast x1.

I have discussed the findings and recommendations with the patient
who agrees to proceed with biopsy. The patient will be scheduled for
the biopsy appointment prior to leaving the office today.

BI-RADS CATEGORY  4: Suspicious.

## 2024-01-26 ENCOUNTER — Telehealth: Payer: Self-pay | Admitting: *Deleted

## 2024-01-26 NOTE — Telephone Encounter (Signed)
 This RN spoke with pt per her call stating she just wanted to know " I have really developed a lot of pain in my joints and was wondering if it could be the anastrozole - and it really seems to be aggravating my arthritis"  This RN verified what type of arthritis with pt stating "just the regular aging kind" (not autoimmune).  Per discussion reviewed pt being on the medication for 2 years - now with more onset of side effects that could be caused by the medication.  Discussed with her choices of holding the medication for 2 weeks to monitor if symptoms improve- which if they do then we know it is the anastrozole.  Also discussed medications for benefit of symptoms with pt currently not taking any - discussed benefit with Aleve bid (reviewed most recent CMET in care everywhere)  Per end of call - pt stated she feels that now that it is likely the medication causing the issues she would like to stay on it and start the Aleve.  She states she motivated to continue the Anastrozole for best outcome.  Pt understands to call if symptoms do not improve as well as this message and plan will be forwarded to Dr Al Pimple for her review of discussion.

## 2024-03-02 ENCOUNTER — Other Ambulatory Visit: Payer: Self-pay | Admitting: Physician Assistant

## 2024-03-02 DIAGNOSIS — Z1231 Encounter for screening mammogram for malignant neoplasm of breast: Secondary | ICD-10-CM

## 2024-04-01 ENCOUNTER — Other Ambulatory Visit: Payer: Self-pay | Admitting: Physician Assistant

## 2024-04-01 DIAGNOSIS — Z853 Personal history of malignant neoplasm of breast: Secondary | ICD-10-CM

## 2024-04-09 ENCOUNTER — Encounter (HOSPITAL_COMMUNITY): Payer: Self-pay

## 2024-04-16 ENCOUNTER — Ambulatory Visit
Admission: RE | Admit: 2024-04-16 | Discharge: 2024-04-16 | Disposition: A | Discharge status: Home / Self Care | Source: Ambulatory Visit | Attending: Physician Assistant

## 2024-04-16 DIAGNOSIS — Z853 Personal history of malignant neoplasm of breast: Secondary | ICD-10-CM

## 2024-04-16 HISTORY — DX: Personal history of irradiation: Z92.3

## 2024-04-22 ENCOUNTER — Encounter

## 2024-06-01 ENCOUNTER — Telehealth: Payer: Self-pay

## 2024-06-01 NOTE — Telephone Encounter (Signed)
 Pt verbally confirmed appt for 7/3

## 2024-06-02 ENCOUNTER — Ambulatory Visit: Payer: Medicare Other | Admitting: Hematology and Oncology

## 2024-06-02 ENCOUNTER — Inpatient Hospital Stay: Payer: Medicare Other | Attending: Hematology and Oncology | Admitting: Hematology and Oncology

## 2024-06-02 VITALS — BP 134/48 | HR 66 | Temp 98.1°F | Resp 16 | Ht 62.0 in | Wt 133.5 lb

## 2024-06-02 DIAGNOSIS — Z923 Personal history of irradiation: Secondary | ICD-10-CM | POA: Insufficient documentation

## 2024-06-02 DIAGNOSIS — C50512 Malignant neoplasm of lower-outer quadrant of left female breast: Secondary | ICD-10-CM | POA: Diagnosis not present

## 2024-06-02 DIAGNOSIS — Z79811 Long term (current) use of aromatase inhibitors: Secondary | ICD-10-CM | POA: Insufficient documentation

## 2024-06-02 DIAGNOSIS — Z87891 Personal history of nicotine dependence: Secondary | ICD-10-CM | POA: Diagnosis not present

## 2024-06-02 DIAGNOSIS — Z170 Estrogen receptor positive status [ER+]: Secondary | ICD-10-CM | POA: Diagnosis not present

## 2024-06-02 DIAGNOSIS — Z1721 Progesterone receptor positive status: Secondary | ICD-10-CM | POA: Diagnosis not present

## 2024-06-02 DIAGNOSIS — Z1732 Human epidermal growth factor receptor 2 negative status: Secondary | ICD-10-CM | POA: Diagnosis not present

## 2024-06-02 DIAGNOSIS — Z803 Family history of malignant neoplasm of breast: Secondary | ICD-10-CM | POA: Insufficient documentation

## 2024-06-02 MED ORDER — TAMOXIFEN CITRATE 20 MG PO TABS: 20.0000 mg | ORAL_TABLET | Freq: Every day | ORAL | 3 refills | Status: AC

## 2024-06-02 NOTE — Progress Notes (Signed)
 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  Diagnostic mammogram showed indeterminate findings suspicious mass without sonographic correlate in the central left breast.   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   07/22/2022 Cancer Staging   Staging form: Breast, AJCC 8th Edition - Pathologic stage from 07/22/2022: Stage IA (pT1a, pN0, cM0, G1, ER+, PR+, HER2-) - Signed by Izell Domino, MD on 07/22/2022 Stage prefix: Initial diagnosis Method of lymph node assessment: Clinical Histologic grading system: 3 grade system   07/29/2022 - 08/19/2022 Radiation Therapy   Site Technique Total Dose (Gy) Dose per Fx (Gy) Completed Fx Beam Energies  Breast, Left: Breast_L 3D 40.05/40.05 2.67 15/15 6XFFF     08/2022 -  Anti-estrogen oral therapy   Anastrozole      INTERVAL HISTORY:   Discussed the use of AI scribe software for clinical note transcription with the patient, who gave verbal consent to proceed.  History of Present Illness Kristin Molina is a 78 year old female with breast cancer on anastrozole  who is here for follow up.  She experiences severe joint pain, described as 'terrible' and worsening, primarily in her wrists and knees. A knot in her right wrist affects her ability to hold objects, necessitating the use of both hands. She wears a brace on her right thumb and another on her left hand for support, though the latter is less effective. 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  Her left knee has developed fluid, attributed to a twisting injury while leaning against a garage frame, resulting in sudden pain. She uses a sleeve on her knee for support during outdoor activities. Joint stiffness, particularly in her hips, causes difficulty in getting up and down.  She takes Aleve for pain relief but struggles with daily use. She is on anastrozole  for breast cancer treatment and also takes Fosamax. No hot flashes from anastrozole , but she is concerned about the joint pain it causes.  In her social history, she is a former smoker, having quit a long time ago, and has no history of blood clots. She is right-handed, which exacerbates the impact of her right wrist pain on daily activities.  Rest of the pertinent 10 point ROS reviewed and neg.   PAST MEDICAL/SURGICAL HISTORY:  Past Medical History:  Diagnosis Date   Arthritis    hips, hands   Cancer (HCC) 05/2022   left breast IDC   Colon polyps    Hypothyroidism    Osteoporosis    Personal history of radiation therapy    PONV (postoperative nausea and vomiting)    Past Surgical History:  Procedure Laterality Date  BREAST LUMPECTOMY     BREAST LUMPECTOMY WITH RADIOACTIVE SEED LOCALIZATION Left 06/26/2022   Procedure: LEFT BREAST LUMPECTOMY WITH RADIOACTIVE SEED LOCALIZATION;  Surgeon: Vanderbilt Ned, MD;  Location: [facility];  Service: General;  Laterality: Left;   CERVICAL LAMINECTOMY     TUBAL LIGATION        ALLERGIES:  Allergies  Allergen Reactions   Dust Mite Extract Cough     CURRENT MEDICATIONS:  Outpatient Encounter Medications as of 06/02/2024  Medication Sig   tamoxifen (NOLVADEX) 20 MG tablet Take 1 tablet (20 mg total) by mouth daily.   albuterol (VENTOLIN HFA) 108 (90 Base) MCG/ACT inhaler Inhale 1 puff into the lungs every 6 (six) hours as needed.   alendronate (FOSAMAX) 70 MG tablet Take 70 mg by mouth once a week.   aspirin EC 81 MG tablet Take 81 mg by mouth daily. Swallow whole.   Cholecalciferol (D3-1000) 25 MCG (1000 UT) capsule Take 1,000 Units by mouth at bedtime.   Cyanocobalamin (CVS B12 QUICK DISSOLVE PO) Take 5,000 mcg by mouth daily.   levothyroxine (SYNTHROID) 75 MCG tablet Take 75 mcg by mouth daily.   lovastatin (MEVACOR) 40 MG tablet Take 40 mg by mouth at bedtime.   Magnesium 500 MG CAPS Take by mouth.   Multiple Vitamins-Minerals (MULTIVITAMIN ADULTS 50+ PO) Take 1 tablet by mouth daily.   Omega-3 Fatty Acids (FISH OIL) 1000 MG CAPS Take 1 capsule by mouth daily.   TURMERIC CURCUMIN PO Take 1 tablet by mouth daily.   [DISCONTINUED] anastrozole  (ARIMIDEX ) 1 MG tablet Take 1 tablet (1 mg total) by mouth daily.   No facility-administered encounter medications on file as of 06/02/2024.     ONCOLOGIC FAMILY HISTORY:  Family History  Problem Relation Age of Onset   Breast cancer Mother    Diabetes Mother    Colon cancer Neg Hx    Esophageal cancer Neg Hx    Pancreatic cancer Neg Hx    Stomach cancer Neg Hx    Liver disease Neg Hx      SOCIAL HISTORY:  Social History   Socioeconomic History   Marital status: Married    Spouse name: Not on file   Number of children: Not on file   Years of education: Not on file   Highest education level: Not on file  Occupational History   Not on file  Tobacco Use   Smoking status: Former    Current packs/day: 0.00    Types: Cigarettes    Quit date: 1980    Years since quitting: 45.5   Smokeless tobacco:  Never  Vaping Use   Vaping status: Never Used  Substance and Sexual Activity   Alcohol use: Yes    Comment: 1 drink daily   Drug use: Never   Sexual activity: Yes    Birth control/protection: Post-menopausal  Other Topics Concern   Not on file  Social History Narrative   Not on file   Social Drivers of Health   Financial Resource Strain: Low Risk  (04/12/2024)   Received from [facility]   Overall Financial Resource Strain (CARDIA)    Difficulty of Paying Living Expenses: Not hard at all  Food Insecurity: No Food Insecurity (04/12/2024)   Received from [facility]   Hunger Vital Sign    Within the past 12 months, you worried that your food would run out before you got the money to buy more.: Never true    Within the past 12 months, the  food you bought just didn't last and you didn't have money to get more.: Never true  Transportation Needs: Unknown (04/12/2024)   Received from [facility]   PRAPARE - Transportation    Lack of Transportation (Medical): No    Lack of Transportation (Non-Medical): Not on file  Physical Activity: Insufficiently Active (04/12/2024)   Received from [facility]   Exercise Vital Sign    On average, how many days per week do you engage in moderate to strenuous exercise (like a brisk walk)?: 2 days    On average, how many minutes do you engage in exercise at this level?: 30 min  Stress: No Stress Concern Present (04/12/2024)   Received from [facility] - Occupational Stress Questionnaire    Feeling of Stress : Not at all  Social Connections: Moderately Integrated (04/12/2024)   Received from [facility]   Social Network    How would you rate your social network (family, work, friends)?: Adequate participation with social networks  Intimate Partner Violence: Not At Risk (04/12/2024)   Received from [facility]   HITS    Over the last 12 months how often did your partner physically hurt you?: Never     Over the last 12 months how often did your partner insult you or talk down to you?: Never    Over the last 12 months how often did your partner threaten you with physical harm?: Never    Over the last 12 months how often did your partner scream or curse at you?: Never     OBSERVATIONS/OBJECTIVE:  BP (!) 134/48 (BP Location: Left Arm, Patient Position: Sitting)   Pulse 66   Temp 98.1 F (36.7 C) (Temporal)   Resp 16   Ht 5' 2 (1.575 m)   Wt 133 lb 8 oz (60.6 kg)   SpO2 100%   BMI 24.42 kg/m   BREAST EXAM: Left breast status post lumpectomy and radiation no sign of local recurrence right breast is benign No regional adenopathy Left nipple chronically inverted.  LABORATORY DATA:  None for this visit.  DIAGNOSTIC IMAGING:  None for this visit.      ASSESSMENT AND PLAN:  Kristin Molina is a pleasant 78 y.o. female with Stage 1A left breast invasive ductal carcinoma, ER+/PR+/HER2-, diagnosed in 05/2022, treated with lumpectomy, adjuvant radiation therapy, and anti-estrogen therapy with anastrozole  beginning in 08/2022.     Assessment and Plan Assessment & Plan Breast cancer On anastrozole  with significant joint pain. Discussed tamoxifen as an alternative, noting low risks of blood clots and uterine lining thickening/endometrial carcinoma, with potential bone density benefits. - Discontinue anastrozole . - Prescribe tamoxifen. - Discuss tamoxifen side effects, including blood clot risk and uterine lining thickening. - Advise to report breakthrough bleeding. - Follow-up call in six weeks to assess tamoxifen response. She is on fosamax for bone density issues.  - Most recent mammogram in May 2025, stable lumpectomy changes of left breast. Stable right breast.   The patient was provided an opportunity to ask questions and all were answered. The patient agreed with the plan and demonstrated an understanding of the instructions.   Total encounter time:30 minutes*in face-to-face  visit time, chart review, lab review, care coordination, order entry, and documentation of the encounter time.    *Total Encounter Time as defined by the Centers for Medicare and Medicaid Services includes, in addition to the face-to-face time of a patient visit (documented in the note above) non-face-to-face time: obtaining  and reviewing outside history, ordering and reviewing medications, tests or procedures, care coordination (communications with other health care professionals or caregivers) and documentation in the medical record.

## 2024-07-28 ENCOUNTER — Inpatient Hospital Stay: Attending: Hematology and Oncology | Admitting: Hematology and Oncology

## 2024-07-28 DIAGNOSIS — C50512 Malignant neoplasm of lower-outer quadrant of left female breast: Secondary | ICD-10-CM | POA: Diagnosis not present

## 2024-07-28 DIAGNOSIS — Z170 Estrogen receptor positive status [ER+]: Secondary | ICD-10-CM

## 2024-07-28 NOTE — Progress Notes (Signed)
 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  Diagnostic mammogram showed indeterminate findings suspicious mass without sonographic correlate in the central left breast.   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   07/22/2022 Cancer Staging   Staging form: Breast, AJCC 8th Edition - Pathologic stage from 07/22/2022: Stage IA (pT1a, pN0, cM0, G1, ER+, PR+, HER2-) - Signed by Izell Domino, MD on 07/22/2022 Stage prefix: Initial diagnosis Method of lymph node assessment: Clinical Histologic grading system: 3 grade system   07/29/2022 - 08/19/2022 Radiation Therapy   Site Technique Total Dose (Gy) Dose per Fx (Gy) Completed Fx Beam Energies  Breast, Left: Breast_L 3D 40.05/40.05 2.67 15/15 6XFFF     08/2022 -  Anti-estrogen oral therapy   Anastrozole      INTERVAL HISTORY:   Discussed the use of AI scribe software for clinical note transcription with the patient, who gave verbal consent to proceed.  History of Present Illness Kristin Molina is a 78 year old female with breast cancer on anastrozole  who is here for follow up. She is here for a telephone follow up. Since her last visit here, she denies any health complaints at all.  She is tolerating tamoxifen  very well.  She has not noticed any significant joint pain except for ongoing dyspnea.  She does have some mild hot flashes.  Over she feels tamoxifen  is much better tolerated than anastrozole .    Rest of the pertinent 10 point ROS reviewed and negative  PAST MEDICAL/SURGICAL HISTORY:  Past Medical History:  Diagnosis Date   Arthritis    hips, hands   Cancer (HCC) 05/2022   left breast IDC   Colon polyps    Hypothyroidism    Osteoporosis    Personal history of radiation therapy    PONV (postoperative nausea and vomiting)    Past Surgical History:  Procedure Laterality Date   BREAST LUMPECTOMY     BREAST LUMPECTOMY WITH RADIOACTIVE SEED LOCALIZATION Left 06/26/2022   Procedure: LEFT BREAST LUMPECTOMY WITH RADIOACTIVE SEED LOCALIZATION;  Surgeon: Vanderbilt Ned, MD;  Location: [facility];  Service: General;  Laterality: Left;   CERVICAL LAMINECTOMY     TUBAL LIGATION       ALLERGIES:  Allergies  Allergen Reactions   Dust Mite Extract Cough     CURRENT MEDICATIONS:  Outpatient Encounter Medications as of 07/28/2024  Medication Sig   albuterol (VENTOLIN HFA) 108 (90 Base) MCG/ACT inhaler Inhale 1 puff into the lungs every 6 (six) hours as needed.   alendronate (FOSAMAX) 70 MG tablet Take 70 mg by mouth once a week.   aspirin EC 81 MG tablet Take 81 mg by  mouth daily. Swallow whole.   Cholecalciferol (D3-1000) 25 MCG (1000 UT) capsule Take 1,000 Units by mouth at bedtime.   Cyanocobalamin (CVS B12 QUICK DISSOLVE PO) Take 5,000 mcg by mouth daily.   levothyroxine (SYNTHROID) 75 MCG tablet Take 75 mcg by mouth daily.   lovastatin (MEVACOR) 40 MG tablet Take 40 mg by mouth at bedtime.   Magnesium 500 MG CAPS Take by mouth.   Multiple  Vitamins-Minerals (MULTIVITAMIN ADULTS 50+ PO) Take 1 tablet by mouth daily.   Omega-3 Fatty Acids (FISH OIL) 1000 MG CAPS Take 1 capsule by mouth daily.   tamoxifen  (NOLVADEX ) 20 MG tablet Take 1 tablet (20 mg total) by mouth daily.   TURMERIC CURCUMIN PO Take 1 tablet by mouth daily.   No facility-administered encounter medications on file as of 07/28/2024.     ONCOLOGIC FAMILY HISTORY:  Family History  Problem Relation Age of Onset   Breast cancer Mother    Diabetes Mother    Colon cancer Neg Hx    Esophageal cancer Neg Hx    Pancreatic cancer Neg Hx    Stomach cancer Neg Hx    Liver disease Neg Hx      SOCIAL HISTORY:  Social History   Socioeconomic History   Marital status: Married    Spouse name: Not on file   Number of children: Not on file   Years of education: Not on file   Highest education level: Not on file  Occupational History   Not on file  Tobacco Use   Smoking status: Former    Current packs/day: 0.00    Types: Cigarettes    Quit date: 1980    Years since quitting: 45.6   Smokeless tobacco: Never  Vaping Use   Vaping status: Never Used  Substance and Sexual Activity   Alcohol use: Yes    Comment: 1 drink daily   Drug use: Never   Sexual activity: Yes    Birth control/protection: Post-menopausal  Other Topics Concern   Not on file  Social History Narrative   Not on file   Social Drivers of Health   Financial Resource Strain: Low Risk  (07/13/2024)   Received from [facility]   Overall Financial Resource Strain (CARDIA)    How hard is it for you to pay for the very basics like food, housing, medical care, and heating?: Not hard at all  Food Insecurity: No Food Insecurity (07/13/2024)   Received from [facility]   Hunger Vital Sign    Within the past 12 months, you worried that your food would run out before you got the money to buy more.: Never true    Within the past 12 months, the food you bought just didn't last and you didn't have  money to get more.: Never true  Transportation Needs: No Transportation Needs (07/13/2024)   Received from [facility] - Transportation    In the past 12 months, has lack of transportation kept you from medical appointments or from getting medications?: No    In the past 12 months, has lack of transportation kept you from meetings, work, or from getting things needed for daily living?: No  Physical Activity: Insufficiently Active (04/12/2024)   Received from [facility]   Exercise Vital Sign    On average, how many days per week do you engage in moderate to strenuous exercise (like a brisk walk)?: 2 days    On average, how many minutes do you engage in exercise  at this level?: 30 min  Stress: No Stress Concern Present (04/12/2024)   Received from [facility] - Occupational Stress Questionnaire    Feeling of Stress : Not at all  Social Connections: Moderately Integrated (04/12/2024)   Received from [facility]   Social Network    How would you rate your social network (family, work, friends)?: Adequate participation with social networks  Intimate Partner Violence: Not At Risk (04/12/2024)   Received from [facility]   HITS    Over the last 12 months how often did your partner physically hurt you?: Never    Over the last 12 months how often did your partner insult you or talk down to you?: Never    Over the last 12 months how often did your partner threaten you with physical harm?: Never    Over the last 12 months how often did your partner scream or curse at you?: Never     OBSERVATIONS/OBJECTIVE:  V/S and PE deferred, telephone visit.   LABORATORY DATA:  None for this visit.  DIAGNOSTIC IMAGING:  None for this visit.      ASSESSMENT AND PLAN:  Ms.. Brinkley is a pleasant 78 y.o. female with Stage 1A left breast invasive ductal carcinoma, ER+/PR+/HER2-, diagnosed in 05/2022, treated with lumpectomy, adjuvant radiation  therapy, and anti-estrogen therapy with anastrozole  beginning in 08/2022.    She had really bad arthralgias hence we switched her to tamoxifen  July 2025.  She is here for a telephone follow-up to see how she is tolerating tamoxifen .  She has noticed remarkable difference in her joint pains and joint swelling.  She does have some mild hot flashes with tamoxifen  which are tolerable.  She continues on Fosamax for bone density issues.  She is very pleased with the tamoxifen  so far and will keep us  posted if she notices any new adverse effects at her next visit or sooner if needed.  Next mammogram due in May 2026.  The patient was provided an opportunity to ask questions and all were answered. The patient agreed with the plan and demonstrated an understanding of the instructions.   Total encounter time:10 minutes*, chart review, lab review, care coordination, order entry, and documentation of the encounter time.  I connected with  [facility] on 07/28/24 by a telephone application and verified that I am speaking with the correct person using two identifiers.   I discussed the limitations of evaluation and management by telemedicine. The patient expressed understanding and agreed to proceed.  Location of pt: Home Location of provider: office  Time spent: 10 min  *Total Encounter Time as defined by the Centers for Medicare and Medicaid Services includes, in addition to the face-to-face time of a patient visit (documented in the note above) non-face-to-face time: obtaining and reviewing outside history, ordering and reviewing medications, tests or procedures, care coordination (communications with other health care professionals or caregivers) and documentation in the medical record.

## 2024-11-14 ENCOUNTER — Telehealth: Payer: Self-pay | Admitting: Hematology and Oncology

## 2024-11-14 NOTE — Telephone Encounter (Signed)
 Resceduled 12/05/24 Appt to 12/08/24. Patient is aware of new appt date and time.

## 2024-12-05 ENCOUNTER — Ambulatory Visit: Admitting: Hematology and Oncology

## 2024-12-08 ENCOUNTER — Inpatient Hospital Stay: Payer: Self-pay | Attending: Hematology and Oncology | Admitting: Hematology and Oncology

## 2024-12-08 VITALS — BP 138/64 | HR 76 | Temp 98.1°F | Resp 17 | Wt 133.7 lb

## 2024-12-08 DIAGNOSIS — C50512 Malignant neoplasm of lower-outer quadrant of left female breast: Secondary | ICD-10-CM

## 2024-12-08 DIAGNOSIS — Z170 Estrogen receptor positive status [ER+]: Secondary | ICD-10-CM | POA: Diagnosis not present

## 2024-12-08 NOTE — Progress Notes (Signed)
 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  Diagnostic mammogram showed indeterminate findings suspicious mass without sonographic correlate in the central left breast.   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   07/22/2022 Cancer Staging   Staging form: Breast, AJCC 8th Edition - Pathologic stage from 07/22/2022: Stage IA (pT1a, pN0, cM0, G1, ER+, PR+, HER2-) - Signed by Izell Domino, MD on 07/22/2022 Stage prefix: Initial diagnosis Method of lymph node assessment: Clinical Histologic grading system: 3 grade system   07/29/2022 - 08/19/2022 Radiation Therapy   Site Technique Total Dose (Gy) Dose per Fx (Gy) Completed Fx Beam Energies  Breast, Left: Breast_L 3D 40.05/40.05 2.67 15/15 6XFFF     08/2022 -  Anti-estrogen oral therapy   Anastrozole      INTERVAL HISTORY:   Discussed the use of AI scribe software for clinical note transcription with the patient, who gave verbal consent to proceed.  History of Present Illness  Kristin Molina is a 79 year old female with estrogen receptor positive left breast cancer on tamoxifen  who presents for routine hematology/oncology follow-up.  She continues tamoxifen  for estrogen receptor positive left breast cancer. Arthralgias, previously a concern, have improved but persist, primarily affecting the wrists. She attributes this to underlying arthritis and carpal tunnel syndrome, and uses a wrist brace for symptomatic relief. Left nipple inversion has been present since birth, as reported by the patient. She continues alendronate and vitamin D3 for osteoporosis.  She manages chronic constipation with as-needed polyethylene glycol, typically every two to three days. She denies new gastrointestinal symptoms.  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  Her last mammogram was in May 2025. She denies new or concerning symptoms related to her malignancies.  PAST MEDICAL/SURGICAL HISTORY:  Past Medical History:  Diagnosis Date   Arthritis    hips, hands   Cancer (HCC) 05/2022   left breast IDC   Colon polyps    Hypothyroidism    Osteoporosis    Personal history of radiation therapy    PONV (postoperative nausea and vomiting)    Past Surgical History:  Procedure Laterality Date   BREAST LUMPECTOMY     BREAST LUMPECTOMY WITH RADIOACTIVE SEED LOCALIZATION Left 06/26/2022   Procedure: LEFT BREAST LUMPECTOMY WITH RADIOACTIVE SEED LOCALIZATION;  Surgeon: Vanderbilt Ned, MD;  Location: [facility];  Service: General;  Laterality: Left;   CERVICAL LAMINECTOMY     TUBAL LIGATION       ALLERGIES:  Allergies  Allergen Reactions   Dust Mite Extract Cough     CURRENT MEDICATIONS:  Outpatient Encounter Medications as  of 12/08/2024  Medication Sig   albuterol (VENTOLIN HFA) 108 (90 Base) MCG/ACT inhaler Inhale 1 puff into the lungs every 6 (six) hours as needed.   alendronate (FOSAMAX) 70 MG  tablet Take 70 mg by mouth once a week.   aspirin EC 81 MG tablet Take 81 mg by mouth daily. Swallow whole.   Cholecalciferol (D3-1000) 25 MCG (1000 UT) capsule Take 1,000 Units by mouth at bedtime.   Cyanocobalamin (CVS B12 QUICK DISSOLVE PO) Take 5,000 mcg by mouth daily.   levothyroxine (SYNTHROID) 75 MCG tablet Take 75 mcg by mouth daily.   lovastatin (MEVACOR) 40 MG tablet Take 40 mg by mouth at bedtime.   Magnesium 500 MG CAPS Take by mouth.   Multiple Vitamins-Minerals (MULTIVITAMIN ADULTS 50+ PO) Take 1 tablet by mouth daily.   Omega-3 Fatty Acids (FISH OIL) 1000 MG CAPS Take 1 capsule by mouth daily.   tamoxifen  (NOLVADEX ) 20 MG tablet Take 1 tablet (20 mg total) by mouth daily.   TURMERIC CURCUMIN PO Take 1 tablet by mouth daily.   No facility-administered encounter medications on file as of 12/08/2024.     ONCOLOGIC FAMILY HISTORY:  Family History  Problem Relation Age of Onset   Breast cancer Mother    Diabetes Mother    Colon cancer Neg Hx    Esophageal cancer Neg Hx    Pancreatic cancer Neg Hx    Stomach cancer Neg Hx    Liver disease Neg Hx      SOCIAL HISTORY:  Social History   Socioeconomic History   Marital status: Married    Spouse name: Not on file   Number of children: Not on file   Years of education: Not on file   Highest education level: Not on file  Occupational History   Not on file  Tobacco Use   Smoking status: Former    Current packs/day: 0.00    Types: Cigarettes    Quit date: 1980    Years since quitting: 46.0   Smokeless tobacco: Never  Vaping Use   Vaping status: Never Used  Substance and Sexual Activity   Alcohol use: Yes    Comment: 1 drink daily   Drug use: Never   Sexual activity: Yes    Birth control/protection: Post-menopausal  Other Topics Concern   Not on file  Social History Narrative   Not on file   Social Drivers of Health   Tobacco Use: Medium Risk (10/21/2024)   Received from [facility]   Patient History     Smoking Tobacco Use: Former    Smokeless Tobacco Use: Never    Passive Exposure: Not on file  Financial Resource Strain: Low Risk (10/18/2024)   Received from [facility]   Overall Financial Resource Strain (CARDIA)    How hard is it for you to pay for the very basics like food, housing, medical care, and heating?: Not hard at all  Food Insecurity: No Food Insecurity (10/18/2024)   Received from [facility]   Epic    Within the past 12 months, you worried that your food would run out before you got the money to buy more.: Never true    Within the past 12 months, the food you bought just didn't last and you didn't have money to get more.: Never true  Transportation Needs: No Transportation Needs (10/18/2024)   Received from [facility]    In the past 12 months, has lack of transportation kept you from medical  appointments or from getting medications?: No    In the past 12 months, has lack of transportation kept you from meetings, work, or from getting things needed for daily living?: No  Physical Activity: Inactive (10/18/2024)   Received from [facility]   Exercise Vital Sign    On average, how many days per week do you engage in moderate to strenuous exercise (like a brisk walk)?: 0 days    Minutes of Exercise per Session: Not on file  Stress: No Stress Concern Present (10/18/2024)   Received from [facility] of Occupational Health - Occupational Stress Questionnaire    Do you feel stress - tense, restless, nervous, or anxious, or unable to sleep at night because your mind is troubled all the time - these days?: Not at all  Social Connections: Socially Integrated (10/18/2024)   Received from [facility]   Social Network    How would you rate your social network (family, work, friends)?: Good participation with social networks  Intimate Partner Violence: Not At Risk (10/18/2024)   Received from [facility]   HITS    Over the last 12 months  how often did your partner physically hurt you?: Never    Over the last 12 months how often did your partner insult you or talk down to you?: Never    Over the last 12 months how often did your partner threaten you with physical harm?: Never    Over the last 12 months how often did your partner scream or curse at you?: Never  Depression (PHQ2-9): Low Risk (12/08/2024)   Depression (PHQ2-9)    PHQ-2 Score: 0  Alcohol Screen: Not on file  Housing: Low Risk (10/18/2024)   Received from [facility]    In the last 12 months, was there a time when you were not able to pay the mortgage or rent on time?: No    In the past 12 months, how many times have you moved where you were living?: 0    At any time in the past 12 months, were you homeless or living in a shelter (including now)?: No  Utilities: Not At Risk (10/18/2024)   Received from [facility]    In the past 12 months has the electric, gas, oil, or water company threatened to shut off services in your home?: No  Health Literacy: Not on file     OBSERVATIONS/OBJECTIVE:   BP 138/64 (BP Location: Left Arm, Patient Position: Sitting)   Pulse 76   Temp 98.1 F (36.7 C)   Resp 17   Wt 133 lb 11.2 oz (60.6 kg)   SpO2 97%   BMI 24.45 kg/m   Physical Exam Constitutional:      Appearance: Normal appearance.  Cardiovascular:     Rate and Rhythm: Normal rate and regular rhythm.  Pulmonary:     Effort: Pulmonary effort is normal.     Breath sounds: Normal breath sounds.  Chest:     Comments: Bilateral breasts inspected. No palpable mass. No regional adenopathy. Musculoskeletal:        General: No swelling or tenderness. Normal range of motion.     Cervical back: Normal range of motion and neck supple. No rigidity.  Lymphadenopathy:     Cervical: No cervical adenopathy.  Skin:    General: Skin is warm and dry.  Neurological:     Mental Status: She is alert.       LABORATORY  DATA:  None for this  visit.  DIAGNOSTIC IMAGING:  None for this visit.      ASSESSMENT AND PLAN:  Ms.. Kristin Molina is a pleasant 79 y.o. female with Stage 1A left breast invasive ductal carcinoma, ER+/PR+/HER2-, diagnosed in 05/2022, treated with lumpectomy, adjuvant radiation therapy, and anti-estrogen therapy with anastrozole  beginning in 08/2022.    She had really bad arthralgias hence we switched her to tamoxifen  July 2025.    Assessment and Plan Assessment & Plan Estrogen receptor positive left breast cancer, lower-outer quadrant Well-managed on tamoxifen  with improved but persistent arthralgias. Basal cell carcinoma on skin, to be evaluated by ENT specialist with surgery planned. - Ordered diagnostic mammogram for May 2026. - Continue tamoxifen  therapy. - Continued Fosamax and vitamin D3 supplementation. - She prefers to follow up here every 6 months. - Schedule oncology follow-up in six months. - Instructed to report any new concerns prior to next visit.  Time spent: 20 min  *Total Encounter Time as defined by the Centers for Medicare and Medicaid Services includes, in addition to the face-to-face time of a patient visit (documented in the note above) non-face-to-face time: obtaining and reviewing outside history, ordering and reviewing medications, tests or procedures, care coordination (communications with other health care professionals or caregivers) and documentation in the medical record.   "

## 2024-12-09 ENCOUNTER — Other Ambulatory Visit: Payer: Self-pay | Admitting: Hematology and Oncology

## 2024-12-09 DIAGNOSIS — Z1231 Encounter for screening mammogram for malignant neoplasm of breast: Secondary | ICD-10-CM

## 2024-12-09 DIAGNOSIS — C50512 Malignant neoplasm of lower-outer quadrant of left female breast: Secondary | ICD-10-CM

## 2025-04-17 ENCOUNTER — Ambulatory Visit

## 2025-06-08 ENCOUNTER — Inpatient Hospital Stay: Admitting: Hematology and Oncology
